# Patient Record
Sex: Male | Born: 1970 | Smoking: Never smoker
Health system: Southern US, Community
[De-identification: ages and names within clinical notes are randomized; demographics above are authoritative.]

## PROBLEM LIST (undated history)

## (undated) DIAGNOSIS — I10 Essential (primary) hypertension: Secondary | ICD-10-CM

## (undated) DIAGNOSIS — E119 Type 2 diabetes mellitus without complications: Secondary | ICD-10-CM

## (undated) DIAGNOSIS — K219 Gastro-esophageal reflux disease without esophagitis: Secondary | ICD-10-CM

## (undated) DIAGNOSIS — K5792 Diverticulitis of intestine, part unspecified, without perforation or abscess without bleeding: Secondary | ICD-10-CM

---

## 2016-05-05 ENCOUNTER — Other Ambulatory Visit: Payer: Self-pay | Admitting: *Deleted

## 2016-05-05 NOTE — Patient Outreach (Signed)
[facility](THN) Care Management  05/05/2016  Robert NeverBradford Kerr [birth year] [id number]   Subjective: Telephone call to patient's home number, message states invalid number, and unable to leave a message.   Objective: Per chart and [employer] review, patient hospitalized 03/27/16 - 03/31/16 for diverticulitis and perforation.    Assessment: Received [employer] Transition of care referral on 04/13/16.   Transition of care follow up pending patient contact.   Plan: RNCM will call patient for 2nd telephone outreach attempt, transition of care follow up, within 10 business days of receiving valid phone number. RNCM will contact [employer] RNCM to verify patient's contact phone number and obtain additional contact numbers if available within 10 business days.    Jesenya Bowditch H. Gardiner Barefootooper RN, BSN, CCM [facility] Telephonic CM Phone: [phone] Fax: [phone]

## 2016-05-06 ENCOUNTER — Other Ambulatory Visit: Payer: Self-pay | Admitting: *Deleted

## 2016-05-06 NOTE — Patient Outreach (Addendum)
[facility](THN) Care Management  05/06/2016  Robert Kerr Apr 28, 1971 [id number]  Subjective: RNCM contacted Robert Kerr [employer] RNCM via secure email and requested phone number verification.  Received secure email from Robert Kerr at [employer], states she has no contact numbers for this patient.   Objective: Per chart and [employer] i[employer]aborative review, patient hospitalized 03/27/16 - 03/31/16 for diverticulitis and perforation.  Patient has no primary MD listed in chart.  Per [employer] [facility] is Robert [facility].    Assessment: Received [employer] Transition of care referral on 04/13/16.   Transition of care follow up pending patient contact.   Plan: RNCM will call patient for 2nd telephone outreach attempt, transition of care follow up, within 10 business days of receiving valid phone number. RNCM will contact patient's [facility] to  request phone number verification, obtain additional contact numbers if available,  within 10 business days.    Kalee Broxton H. Gardiner Barefootooper RN, BSN, CCM [facility] Care Management [facility] Telephonic CM Phone: [phone] Fax: [phone]

## 2016-05-10 ENCOUNTER — Other Ambulatory Visit: Payer: Self-pay | Admitting: *Deleted

## 2016-05-10 ENCOUNTER — Encounter: Payer: Self-pay | Admitting: *Deleted

## 2016-05-10 NOTE — Patient Outreach (Addendum)
[facility](THN) Care Management  05/10/2016  Caryl NeverBradford Mellott [birth year] [id number]   Subjective: Telephone call to patient's home phone number, no answer, invalid number, and unable to leave a message.  Patient does not have a primary MD listed in chart and no contact number for patient's [facility].   Several MDs listed in google search with same name as [facility].    Objective:Per chart and [employer] i[employer]ative review, patient hospitalized 03/27/16 - 03/31/16 for diverticulitis and perforation.  Patient has no primary MD listed in chart.  Per iCollaborative [facility] is Dr. Robb MatarMillard Thomas.    Assessment: Received [employer] Transition of care referral on 04/13/16. Transition of care follow up pending patient contact.    Plan: RNCM will send patient unsuccessful outreach letter, [facility] pamphlet, and proceed with case closure, within 10 business days, if no return call from patient.     Alison Breeding H. Gardiner Barefootooper RN, BSN, CCM [facility] Care [facility] Telephonic CM Phone: [phone] Fax: [phone]

## 2016-05-24 ENCOUNTER — Other Ambulatory Visit: Payer: Self-pay | Admitting: *Deleted

## 2016-05-24 NOTE — Patient Outreach (Signed)
[facility](THN) Care Management  05/24/2016  Caryl NeverBradford Hostetler [birth year] [id number]  No response from patient outreach attempts will proceed with case closure.  Objective:Per chart and [employer] i[employer]aborative review, patient hospitalized 03/27/16 - 03/31/16 for diverticulitis and perforation. Patient has no primary MD listed in chart. Per iCollaborative patient[facility] is Dr. Robb MatarMillard Thomas.    Assessment: Received [employer] Transition of care referral on 04/13/16. Transition of care follow up not completed due to patient being unable to contact.  Will proceed with case closure.   Plan: RNCM will send case closure due to unable to contact request to Iverson AlaminLaura Greeson at [facility].     Kerstin Crusoe H. Gardiner Barefootooper RN, BSN, CCM [facility] Care Management [facility] Telephonic CM Phone: [phone] Fax: [phone]

## 2018-06-14 ENCOUNTER — Emergency Department (HOSPITAL_COMMUNITY): Payer: Managed Care, Other (non HMO)

## 2018-06-14 ENCOUNTER — Inpatient Hospital Stay (HOSPITAL_COMMUNITY)
Admission: EM | Admit: 2018-06-14 | Discharge: 2018-06-15 | DRG: 247 | Disposition: A | Discharge status: Home / Self Care | Payer: Managed Care, Other (non HMO) | Attending: Interventional Cardiology | Admitting: Interventional Cardiology

## 2018-06-14 ENCOUNTER — Encounter (HOSPITAL_COMMUNITY): Payer: Self-pay | Admitting: Emergency Medicine

## 2018-06-14 ENCOUNTER — Other Ambulatory Visit: Payer: Self-pay

## 2018-06-14 ENCOUNTER — Encounter (HOSPITAL_COMMUNITY)
Admission: EM | Disposition: A | Discharge status: Home / Self Care | Payer: Self-pay | Source: Home / Self Care | Attending: Interventional Cardiology

## 2018-06-14 DIAGNOSIS — Z7984 Long term (current) use of oral hypoglycemic drugs: Secondary | ICD-10-CM | POA: Diagnosis not present

## 2018-06-14 DIAGNOSIS — I2510 Atherosclerotic heart disease of native coronary artery without angina pectoris: Secondary | ICD-10-CM

## 2018-06-14 DIAGNOSIS — R079 Chest pain, unspecified: Secondary | ICD-10-CM | POA: Diagnosis present

## 2018-06-14 DIAGNOSIS — E119 Type 2 diabetes mellitus without complications: Secondary | ICD-10-CM | POA: Diagnosis present

## 2018-06-14 DIAGNOSIS — Z79899 Other long term (current) drug therapy: Secondary | ICD-10-CM

## 2018-06-14 DIAGNOSIS — Z8249 Family history of ischemic heart disease and other diseases of the circulatory system: Secondary | ICD-10-CM | POA: Diagnosis not present

## 2018-06-14 DIAGNOSIS — Z6841 Body Mass Index (BMI) 40.0 and over, adult: Secondary | ICD-10-CM

## 2018-06-14 DIAGNOSIS — Z885 Allergy status to narcotic agent status: Secondary | ICD-10-CM | POA: Diagnosis not present

## 2018-06-14 DIAGNOSIS — Z8673 Personal history of transient ischemic attack (TIA), and cerebral infarction without residual deficits: Secondary | ICD-10-CM

## 2018-06-14 DIAGNOSIS — E785 Hyperlipidemia, unspecified: Secondary | ICD-10-CM | POA: Diagnosis present

## 2018-06-14 DIAGNOSIS — Z955 Presence of coronary angioplasty implant and graft: Secondary | ICD-10-CM | POA: Diagnosis not present

## 2018-06-14 DIAGNOSIS — I214 Non-ST elevation (NSTEMI) myocardial infarction: Secondary | ICD-10-CM | POA: Diagnosis present

## 2018-06-14 DIAGNOSIS — I10 Essential (primary) hypertension: Secondary | ICD-10-CM | POA: Diagnosis present

## 2018-06-14 DIAGNOSIS — E782 Mixed hyperlipidemia: Secondary | ICD-10-CM | POA: Diagnosis not present

## 2018-06-14 DIAGNOSIS — E1159 Type 2 diabetes mellitus with other circulatory complications: Secondary | ICD-10-CM

## 2018-06-14 DIAGNOSIS — Z880 Allergy status to penicillin: Secondary | ICD-10-CM

## 2018-06-14 DIAGNOSIS — K219 Gastro-esophageal reflux disease without esophagitis: Secondary | ICD-10-CM | POA: Diagnosis present

## 2018-06-14 DIAGNOSIS — E669 Obesity, unspecified: Secondary | ICD-10-CM | POA: Diagnosis present

## 2018-06-14 HISTORY — DX: Gastro-esophageal reflux disease without esophagitis: K21.9

## 2018-06-14 HISTORY — DX: Diverticulitis of intestine, part unspecified, without perforation or abscess without bleeding: K57.92

## 2018-06-14 HISTORY — DX: Essential (primary) hypertension: I10

## 2018-06-14 HISTORY — PX: LEFT HEART CATH AND CORONARY ANGIOGRAPHY: CATH118249

## 2018-06-14 HISTORY — PX: CORONARY STENT INTERVENTION: CATH118234

## 2018-06-14 HISTORY — DX: Type 2 diabetes mellitus without complications: E11.9

## 2018-06-14 LAB — CBC
HCT: 46.7 % (ref 39.0–52.0)
HCT: 43.3 % (ref 39.0–52.0)
Hemoglobin: 15.4 g/dL (ref 13.0–17.0)
Hemoglobin: 14 g/dL (ref 13.0–17.0)
MCH: 28.1 pg (ref 26.0–34.0)
MCH: 27.2 pg (ref 26.0–34.0)
MCHC: 33 g/dL (ref 30.0–36.0)
MCHC: 32.3 g/dL (ref 30.0–36.0)
MCV: 85.2 fL (ref 80.0–100.0)
MCV: 84.1 fL (ref 80.0–100.0)
Platelets: 242 10*3/uL (ref 150–400)
Platelets: 266 10*3/uL (ref 150–400)
RBC: 5.48 MIL/uL (ref 4.22–5.81)
RBC: 5.15 MIL/uL (ref 4.22–5.81)
RDW: 12.4 % (ref 11.5–15.5)
RDW: 12.5 % (ref 11.5–15.5)
WBC: 9.1 10*3/uL (ref 4.0–10.5)
WBC: 12.8 10*3/uL — ABNORMAL HIGH (ref 4.0–10.5)
nRBC: 0 % (ref 0.0–0.2)
nRBC: 0 % (ref 0.0–0.2)

## 2018-06-14 LAB — I-STAT TROPONIN, ED
Troponin i, poc: 0.02 ng/mL (ref 0.00–0.08)
Troponin i, poc: 0.1 ng/mL (ref 0.00–0.08)

## 2018-06-14 LAB — BASIC METABOLIC PANEL
Anion gap: 9 (ref 5–15)
BUN: 11 mg/dL (ref 6–20)
CO2: 25 mmol/L (ref 22–32)
Calcium: 9.4 mg/dL (ref 8.9–10.3)
Chloride: 105 mmol/L (ref 98–111)
Creatinine, Ser: 1 mg/dL (ref 0.61–1.24)
GFR calc Af Amer: >60 mL/min (ref >60)
GFR calc non Af Amer: >60 mL/min (ref >60)
Glucose, Bld: 183 mg/dL — ABNORMAL HIGH (ref 70–99)
Potassium: 4.5 mmol/L (ref 3.5–5.1)
Sodium: 139 mmol/L (ref 135–145)

## 2018-06-14 LAB — POCT ACTIVATED CLOTTING TIME
ACTIVATED CLOTTING TIME: 257 s
ACTIVATED CLOTTING TIME: 279 s
Activated Clotting Time: 252 seconds
Activated Clotting Time: 252 seconds

## 2018-06-14 LAB — CREATININE, SERUM
Creatinine, Ser: 0.98 mg/dL (ref 0.61–1.24)
GFR calc Af Amer: >60 mL/min (ref >60)
GFR calc non Af Amer: >60 mL/min (ref >60)

## 2018-06-14 LAB — TROPONIN I: Troponin I: 4 ng/mL (ref <0.03)

## 2018-06-14 SURGERY — LEFT HEART CATH AND CORONARY ANGIOGRAPHY
Anesthesia: LOCAL

## 2018-06-14 MED ORDER — OXYCODONE HCL 5 MG PO TABS
5.0000 mg | ORAL_TABLET | ORAL | Status: DC | PRN
  Administered 2018-06-14 (×2): 10 mg via ORAL
  Administered 2018-06-15: 5 mg via ORAL
  Filled 2018-06-14: qty 2
  Filled 2018-06-14: qty 1
  Filled 2018-06-14: qty 2

## 2018-06-14 MED ORDER — ACETAMINOPHEN 325 MG PO TABS
650.0000 mg | ORAL_TABLET | ORAL | Status: DC | PRN
  Administered 2018-06-14 – 2018-06-15 (×2): 650 mg via ORAL

## 2018-06-14 MED ORDER — HYDROCHLOROTHIAZIDE 12.5 MG PO CAPS
12.5000 mg | ORAL_CAPSULE | Freq: Every day | ORAL | Status: DC
  Administered 2018-06-15: 12.5 mg via ORAL
  Filled 2018-06-14: qty 1

## 2018-06-14 MED ORDER — TICAGRELOR 90 MG PO TABS
ORAL_TABLET | ORAL | Status: DC | PRN
  Administered 2018-06-14: 180 mg via ORAL

## 2018-06-14 MED ORDER — HEPARIN SODIUM (PORCINE) 5000 UNIT/ML IJ SOLN
5000.0000 [IU] | Freq: Three times a day (TID) | INTRAMUSCULAR | Status: DC
  Filled 2018-06-14: qty 1

## 2018-06-14 MED ORDER — ALUM & MAG HYDROXIDE-SIMETH 200-200-20 MG/5ML PO SUSP
30.0000 mL | ORAL | Status: DC | PRN
  Administered 2018-06-14 (×2): 30 mL via ORAL
  Filled 2018-06-14 (×2): qty 30

## 2018-06-14 MED ORDER — TIROFIBAN HCL IN NACL 5-0.9 MG/100ML-% IV SOLN
INTRAVENOUS | Status: AC
  Filled 2018-06-14: qty 100

## 2018-06-14 MED ORDER — ONDANSETRON HCL 4 MG/2ML IJ SOLN
4.0000 mg | Freq: Four times a day (QID) | INTRAMUSCULAR | Status: DC | PRN
  Filled 2018-06-14: qty 2

## 2018-06-14 MED ORDER — NITROGLYCERIN IN D5W 200-5 MCG/ML-% IV SOLN
0.0000 ug/min | INTRAVENOUS | Status: DC
  Administered 2018-06-14: 10 ug/min via INTRAVENOUS
  Filled 2018-06-14: qty 250

## 2018-06-14 MED ORDER — ATORVASTATIN CALCIUM 80 MG PO TABS: 80.0000 mg | ORAL_TABLET | Freq: Every day | ORAL | Status: DC

## 2018-06-14 MED ORDER — SODIUM CHLORIDE 0.9% FLUSH
3.0000 mL | Freq: Two times a day (BID) | INTRAVENOUS | Status: DC
  Administered 2018-06-14: 3 mL via INTRAVENOUS

## 2018-06-14 MED ORDER — HEPARIN (PORCINE) IN NACL 1000-0.9 UT/500ML-% IV SOLN
INTRAVENOUS | Status: AC
  Filled 2018-06-14: qty 1000

## 2018-06-14 MED ORDER — VERAPAMIL HCL 2.5 MG/ML IV SOLN
INTRAVENOUS | Status: AC
  Filled 2018-06-14: qty 2

## 2018-06-14 MED ORDER — SODIUM CHLORIDE 0.9 % WEIGHT BASED INFUSION
0.8000 mL/kg/h | INTRAVENOUS | Status: AC
  Administered 2018-06-14: 0.8 mL/kg/h via INTRAVENOUS

## 2018-06-14 MED ORDER — TIROFIBAN HCL IN NACL 5-0.9 MG/100ML-% IV SOLN
INTRAVENOUS | Status: AC | PRN
  Administered 2018-06-14 (×2): 0.15 ug/kg/min via INTRAVENOUS

## 2018-06-14 MED ORDER — SODIUM CHLORIDE 0.9 % IV SOLN: 250.0000 mL | INTRAVENOUS | Status: DC | PRN

## 2018-06-14 MED ORDER — OLMESARTAN-AMLODIPINE-HCTZ 20-5-12.5 MG PO TABS: 1.0000 | ORAL_TABLET | Freq: Every day | ORAL | Status: DC

## 2018-06-14 MED ORDER — MORPHINE SULFATE (PF) 2 MG/ML IV SOLN: 2.0000 mg | INTRAVENOUS | Status: DC | PRN

## 2018-06-14 MED ORDER — SODIUM CHLORIDE 0.9 % WEIGHT BASED INFUSION
3.0000 mL/kg/h | INTRAVENOUS | Status: DC
  Administered 2018-06-14: 3 mL/kg/h via INTRAVENOUS

## 2018-06-14 MED ORDER — HEPARIN (PORCINE) IN NACL 1000-0.9 UT/500ML-% IV SOLN
INTRAVENOUS | Status: DC | PRN
  Administered 2018-06-14: 500 mL

## 2018-06-14 MED ORDER — TIROFIBAN (AGGRASTAT) BOLUS VIA INFUSION
INTRAVENOUS | Status: DC | PRN
  Administered 2018-06-14: 3242.5 ug via INTRAVENOUS

## 2018-06-14 MED ORDER — NITROGLYCERIN 1 MG/10 ML FOR IR/CATH LAB
INTRA_ARTERIAL | Status: DC | PRN
  Administered 2018-06-14 (×2): 200 ug via INTRACORONARY

## 2018-06-14 MED ORDER — MIDAZOLAM HCL 2 MG/2ML IJ SOLN
INTRAMUSCULAR | Status: DC | PRN
  Administered 2018-06-14: 1 mg via INTRAVENOUS

## 2018-06-14 MED ORDER — SODIUM CHLORIDE 0.9% FLUSH: 3.0000 mL | INTRAVENOUS | Status: DC | PRN

## 2018-06-14 MED ORDER — ONDANSETRON HCL 4 MG/2ML IJ SOLN
INTRAMUSCULAR | Status: AC
  Filled 2018-06-14: qty 2

## 2018-06-14 MED ORDER — VERAPAMIL HCL 2.5 MG/ML IV SOLN
INTRAVENOUS | Status: DC | PRN
  Administered 2018-06-14: 10 mL via INTRA_ARTERIAL

## 2018-06-14 MED ORDER — HEPARIN SODIUM (PORCINE) 5000 UNIT/ML IJ SOLN
5000.0000 [IU] | Freq: Three times a day (TID) | INTRAMUSCULAR | Status: DC
  Administered 2018-06-15: 5000 [IU] via SUBCUTANEOUS
  Filled 2018-06-14: qty 1

## 2018-06-14 MED ORDER — IRBESARTAN 150 MG PO TABS
150.0000 mg | ORAL_TABLET | Freq: Every day | ORAL | Status: DC
  Administered 2018-06-15: 150 mg via ORAL
  Filled 2018-06-14 (×2): qty 1

## 2018-06-14 MED ORDER — LIDOCAINE HCL (PF) 1 % IJ SOLN
INTRAMUSCULAR | Status: AC
  Filled 2018-06-14: qty 30

## 2018-06-14 MED ORDER — SODIUM CHLORIDE 0.9% FLUSH: 3.0000 mL | Freq: Two times a day (BID) | INTRAVENOUS | Status: DC

## 2018-06-14 MED ORDER — TICAGRELOR 90 MG PO TABS
90.0000 mg | ORAL_TABLET | Freq: Two times a day (BID) | ORAL | Status: DC
  Administered 2018-06-15: 90 mg via ORAL
  Filled 2018-06-14: qty 1

## 2018-06-14 MED ORDER — HEPARIN (PORCINE) 25000 UT/250ML-% IV SOLN
1300.0000 [IU]/h | INTRAVENOUS | Status: DC
  Administered 2018-06-14: 1300 [IU]/h via INTRAVENOUS
  Filled 2018-06-14: qty 250

## 2018-06-14 MED ORDER — MORPHINE SULFATE (PF) 4 MG/ML IV SOLN
4.0000 mg | Freq: Once | INTRAVENOUS | Status: AC
  Administered 2018-06-14: 4 mg via INTRAVENOUS
  Filled 2018-06-14: qty 1

## 2018-06-14 MED ORDER — HEPARIN SODIUM (PORCINE) 1000 UNIT/ML IJ SOLN
INTRAMUSCULAR | Status: AC
  Filled 2018-06-14: qty 2

## 2018-06-14 MED ORDER — ACETAMINOPHEN 325 MG PO TABS
650.0000 mg | ORAL_TABLET | ORAL | Status: DC | PRN
  Filled 2018-06-14 (×2): qty 2

## 2018-06-14 MED ORDER — HEPARIN SODIUM (PORCINE) 1000 UNIT/ML IJ SOLN
INTRAMUSCULAR | Status: AC
  Filled 2018-06-14: qty 1

## 2018-06-14 MED ORDER — ASPIRIN EC 81 MG PO TBEC
81.0000 mg | DELAYED_RELEASE_TABLET | Freq: Every day | ORAL | Status: DC
  Administered 2018-06-15: 81 mg via ORAL
  Filled 2018-06-14: qty 1

## 2018-06-14 MED ORDER — MORPHINE SULFATE (PF) 4 MG/ML IV SOLN: 4.0000 mg | Freq: Once | INTRAVENOUS | Status: DC

## 2018-06-14 MED ORDER — NITROGLYCERIN 1 MG/10 ML FOR IR/CATH LAB
INTRA_ARTERIAL | Status: AC
  Filled 2018-06-14: qty 10

## 2018-06-14 MED ORDER — SODIUM CHLORIDE 0.9 % IV SOLN: INTRAVENOUS | Status: DC

## 2018-06-14 MED ORDER — TIROFIBAN HCL IN NACL 5-0.9 MG/100ML-% IV SOLN
0.1500 ug/kg/min | INTRAVENOUS | Status: AC
  Administered 2018-06-14 – 2018-06-15 (×3): 0.15 ug/kg/min via INTRAVENOUS
  Filled 2018-06-14 (×3): qty 100
  Filled 2018-06-14: qty 200

## 2018-06-14 MED ORDER — ONDANSETRON HCL 4 MG/2ML IJ SOLN
INTRAMUSCULAR | Status: DC | PRN
  Administered 2018-06-14: 4 mg via INTRAVENOUS

## 2018-06-14 MED ORDER — AMLODIPINE BESYLATE 5 MG PO TABS
5.0000 mg | ORAL_TABLET | Freq: Every day | ORAL | Status: DC
  Administered 2018-06-15: 5 mg via ORAL
  Filled 2018-06-14: qty 1

## 2018-06-14 MED ORDER — HYDRALAZINE HCL 20 MG/ML IJ SOLN: 5.0000 mg | INTRAMUSCULAR | Status: AC | PRN

## 2018-06-14 MED ORDER — ASPIRIN 81 MG PO CHEW: 81.0000 mg | CHEWABLE_TABLET | Freq: Every day | ORAL | Status: DC

## 2018-06-14 MED ORDER — LIDOCAINE HCL (PF) 1 % IJ SOLN
INTRAMUSCULAR | Status: DC | PRN
  Administered 2018-06-14: 2 mL

## 2018-06-14 MED ORDER — METOPROLOL TARTRATE 12.5 MG HALF TABLET
12.5000 mg | ORAL_TABLET | Freq: Two times a day (BID) | ORAL | Status: DC
  Administered 2018-06-14 – 2018-06-15 (×2): 12.5 mg via ORAL
  Filled 2018-06-14 (×2): qty 1

## 2018-06-14 MED ORDER — HEPARIN SODIUM (PORCINE) 1000 UNIT/ML IJ SOLN
INTRAMUSCULAR | Status: DC | PRN
  Administered 2018-06-14: 6000 [IU] via INTRAVENOUS
  Administered 2018-06-14: 1500 [IU] via INTRAVENOUS
  Administered 2018-06-14: 2500 [IU] via INTRAVENOUS
  Administered 2018-06-14: 6000 [IU] via INTRAVENOUS
  Administered 2018-06-14: 1500 [IU] via INTRAVENOUS

## 2018-06-14 MED ORDER — FENTANYL CITRATE (PF) 100 MCG/2ML IJ SOLN
INTRAMUSCULAR | Status: DC | PRN
  Administered 2018-06-14 (×3): 25 ug via INTRAVENOUS

## 2018-06-14 MED ORDER — NITROGLYCERIN 0.4 MG SL SUBL
0.4000 mg | SUBLINGUAL_TABLET | SUBLINGUAL | Status: DC | PRN
  Administered 2018-06-14: 0.4 mg via SUBLINGUAL
  Filled 2018-06-14: qty 1

## 2018-06-14 MED ORDER — FENTANYL CITRATE (PF) 100 MCG/2ML IJ SOLN
INTRAMUSCULAR | Status: AC
  Filled 2018-06-14: qty 2

## 2018-06-14 MED ORDER — LABETALOL HCL 5 MG/ML IV SOLN: 10.0000 mg | INTRAVENOUS | Status: AC | PRN

## 2018-06-14 MED ORDER — MIDAZOLAM HCL 2 MG/2ML IJ SOLN
INTRAMUSCULAR | Status: AC
  Filled 2018-06-14: qty 2

## 2018-06-14 MED ORDER — SODIUM CHLORIDE 0.9 % WEIGHT BASED INFUSION: 1.0000 mL/kg/h | INTRAVENOUS | Status: DC

## 2018-06-14 MED ORDER — HEPARIN BOLUS VIA INFUSION
4000.0000 [IU] | Freq: Once | INTRAVENOUS | Status: AC
  Administered 2018-06-14: 4000 [IU] via INTRAVENOUS
  Filled 2018-06-14: qty 4000

## 2018-06-14 MED ORDER — PANTOPRAZOLE SODIUM 40 MG PO TBEC
40.0000 mg | DELAYED_RELEASE_TABLET | Freq: Every day | ORAL | Status: DC
  Administered 2018-06-15: 40 mg via ORAL
  Filled 2018-06-14: qty 1

## 2018-06-14 MED ORDER — ONDANSETRON HCL 4 MG/2ML IJ SOLN: 4.0000 mg | Freq: Four times a day (QID) | INTRAMUSCULAR | Status: DC | PRN

## 2018-06-14 SURGICAL SUPPLY — 22 items
BALLN SAPPHIRE 2.0X12 (BALLOONS) ×2
BALLN SAPPHIRE 2.5X12 (BALLOONS) ×2
BALLN SAPPHIRE ~~LOC~~ 3.25X12 (BALLOONS) ×2 IMPLANT
BALLOON SAPPHIRE 2.0X12 (BALLOONS) ×1 IMPLANT
BALLOON SAPPHIRE 2.5X12 (BALLOONS) ×1 IMPLANT
CATH 5FR JL3.5 JR4 ANG PIG MP (CATHETERS) ×2 IMPLANT
CATH LAUNCHER 5F EBU3.0 (CATHETERS) ×1 IMPLANT
CATH LAUNCHER 6FR JR4 (CATHETERS) ×2 IMPLANT
CATHETER LAUNCHER 5F EBU3.0 (CATHETERS) ×2
DEVICE RAD COMP TR BAND LRG (VASCULAR PRODUCTS) ×2 IMPLANT
GLIDESHEATH SLEND A-KIT 6F 22G (SHEATH) ×2 IMPLANT
GUIDEWIRE INQWIRE 1.5J.035X260 (WIRE) ×1 IMPLANT
HOVERMATT SINGLE USE (MISCELLANEOUS) ×2 IMPLANT
INQWIRE 1.5J .035X260CM (WIRE) ×2
KIT ENCORE 26 ADVANTAGE (KITS) ×2 IMPLANT
KIT HEART LEFT (KITS) ×2 IMPLANT
PACK CARDIAC CATHETERIZATION (CUSTOM PROCEDURE TRAY) ×2 IMPLANT
SHEATH PROBE COVER 6X72 (BAG) ×2 IMPLANT
STENT SYNERGY DES 2.75X20 (Permanent Stent) ×2 IMPLANT
TRANSDUCER W/STOPCOCK (MISCELLANEOUS) ×2 IMPLANT
TUBING CIL FLEX 10 FLL-RA (TUBING) ×2 IMPLANT
WIRE SION BLUE 180 (WIRE) ×2 IMPLANT

## 2018-06-14 NOTE — Plan of Care (Signed)
  Problem: Education: Goal: Knowledge of General Education information will improve Description Including pain rating scale, medication(s)/side effects and non-pharmacologic comfort measures Outcome: Progressing   Problem: Education: Goal: Understanding of CV disease, CV risk reduction, and recovery process will improve Outcome: Progressing   Problem: Cardiovascular: Goal: Ability to achieve and maintain adequate cardiovascular perfusion will improve Outcome: Progressing   Problem: Cardiovascular: Goal: Vascular access site(s) Level 0-1 will be maintained Outcome: Progressing

## 2018-06-14 NOTE — ED Triage Notes (Addendum)
Pt arrives to ED from home with complaints of chest pain starting yesterday after he fasted for his doctors appoinment. CP resolved yesterday after taking rolaids. EMS reports pt awoke this morning with substernal CP that radiates to both arms. Pt took 2x nitro, 325 ASA, and 4mg  zofran upon arrival. Pt CP currently at 6/10.

## 2018-06-14 NOTE — Interval H&P Note (Signed)
Cath Lab Visit (complete for each Cath Lab visit)  Clinical Evaluation Leading to the Procedure:   ACS: Yes.    Non-ACS:    Anginal Classification: CCS III  Anti-ischemic medical therapy: Minimal Therapy (1 class of medications)  Non-Invasive Test Results: No non-invasive testing performed  Prior CABG: No previous CABG      History and Physical Interval Note:  06/14/2018 3:56 PM  Robert Kerr  has presented today for surgery, with the diagnosis of chest pain  The various methods of treatment have been discussed with the patient and family. After consideration of risks, benefits and other options for treatment, the patient has consented to  Procedure(s): LEFT HEART CATH AND CORONARY ANGIOGRAPHY (N/A) as a surgical intervention .  The patient's history has been reviewed, patient examined, no change in status, stable for surgery.  I have reviewed the patient's chart and labs.  Questions were answered to the patient's satisfaction.     Lyn Records III

## 2018-06-14 NOTE — Progress Notes (Signed)
ANTICOAGULATION CONSULT NOTE - Initial Consult  Pharmacy Consult for heparin Indication: chest pain/ACS   Patient Measurements: Height: 5\' 10"  (177.8 cm) Weight: 286 lb (129.7 kg) IBW/kg (Calculated) : 73 Heparin Dosing Weight: 103 kg  Vital Signs: Temp: 97.5 F (36.4 C) (02/12 0825) Temp Source: Oral (02/12 0825) BP: 157/102 (02/12 1315) Pulse Rate: 83 (02/12 1315)  Labs: Recent Labs    06/14/18 0826  HGB 15.4  HCT 46.7  PLT 242  CREATININE 1.00    Assessment: 48 yo male admitted with chest pain. Starting heparin infusion for rule out ACS. No anticoagulation prior to admission. Troponin is elevated. H/h plts wnl.  Goal of Therapy:  Heparin level 0.3-0.7 units/ml Monitor platelets by anticoagulation protocol: Yes    Plan:  -Heparin bolus 4000 units x1 then 1300 units/hr -Daily HL, CBC -Check HL in 6 hours   Baldemar Friday 06/14/2018,1:29 PM

## 2018-06-14 NOTE — Progress Notes (Signed)
At roughly 2300, pt's HR went into 140's while he vomited approximately . Pt has been allowed 2 cups of ice chips since 1900, & recently had 2 bites of jell-o. The jell-o is what precipitated the vomiting episode. After emesis, pt stated he felt better w/ no chest pain.  Vital signs currently stable & pt resting. Will continue to monitor.

## 2018-06-14 NOTE — ED Notes (Signed)
Gave pt urinal 

## 2018-06-14 NOTE — CV Procedure (Signed)
   Left heart cath, coronary angiography, and PCI of right coronary via right radial.  Radial access was achieved with real-time vascular ultrasound.  Widely patent left main.  LAD is large, wraps around the left ventricular apex, has luminal irregularities throughout the mid and distal vessel but no high-grade obstruction.  Circumflex gives origin to 3 obtuse marginals as well as a circumflex PDA.  The first marginal contains mid vessel 60% narrowing.  The second marginal contains mid vessel was difficult to visualize.  The may be a high-grade stenosis in the mid segment.  The third marginal and PDA appear okay.  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  Based on the size of the circumflex, it was felt to be a nondominant vessel.  After discussing findings with the patient he revealed that he still had grade 2/10 chest discomfort and grade 4/10 left arm and armpit discomfort.  Though much less intense and early in the morning it is still present.  Because of ongoing discomfort we pursued PCI of the right coronary.  The lesion was hard to cross.  The lesion was difficult to fully expand.  Ultimately a 20 x 2.75 Synergy was positioned and deployed, ultimately postdilated to 3.25 mm in diameter.  Thrombus embolized into the second acute marginal branch.  Sluggish flow was noted despite wide patency.  Aggrastat was started.  Ending ACT 275 seconds.

## 2018-06-14 NOTE — ED Provider Notes (Signed)
[facility] EMERGENCY DEPARTMENT Provider Note   CSN: [id number] Arrival date & time: 06/14/18  0820     History   Chief Complaint Chief Complaint  Patient presents with  . Chest Pain    HPI Robert Kerr is a 48 y.o. male with history of diabetes, hypertension, diverticulitis presenting for evaluation of acute onset, constant chest pains since around 6 AM this morning.  He reports that he went to see his PCP yesterday for a yearly physical and afterwards at around 10:30 AM he noticed some mild substernal chest pressure which he thought was related to acid reflux as he was fasting for blood work.  He took some Rolaids with resolution of his pain.  He reports that the pain did not radiate and lasted approximately 20 minutes before resolving.  He did not have any chest pain for the remainder of the day yesterday.  He reports that when he awoke this morning he did not have any pain, showered, and then went downstairs to get ready for work.  States that within 20 minutes of going downstairs he developed severe substernal chest pressure radiating to the bilateral upper extremities.  He notes associated shortness of breath, lightheadedness, diaphoresis, and nausea.  Denies abdominal pain, vomiting, or syncope.  He received 325 mg aspirin and 2 sublingual nitroglycerin and 4 mg of Zofran with EMS with improvement in his pain from 10/10 in severity to 6/10 in severity.  Reports that he has never had pain like this before.  He is a non-smoker, denies recreational drug use or alcohol use.  Reports that his father has had multiple heart attacks, his first in his 64s.  The patient does not have a cardiologist and has not had any stress testing.  He does not have any history of hyperlipidemia to his knowledge.  He was out of his diabetes medicines for the last 2 weeks or so and his PCP was going to switch him to a new medication but he has not started this yet.  The history is provided by  the patient.    Past Medical History:  Diagnosis Date  . Diabetes mellitus without complication (HCC)   . Diverticulitis   . GERD (gastroesophageal reflux disease)   . Hypertension     Patient Active Problem List   Diagnosis Date Noted  . NSTEMI (non-ST elevated myocardial infarction) (HCC) 06/14/2018    History reviewed. No pertinent surgical history.      Home Medications    Prior to Admission medications   Medication Sig Start Date End Date Taking? Authorizing Provider  DM-Doxylamine-Acetaminophen (NYQUIL HBP COLD & FLU) 15-6.25-325 MG/15ML LIQD Take 15 mLs by mouth every 4 (four) hours as needed (cold symptoms).   Yes [provider]  FARXIGA 10 MG TABS tablet Take 10 mg by mouth daily. 06/13/18  Yes [provider]  Guaifenesin 1200 MG TB12 Take 1 tablet by mouth every 12 (twelve) hours as needed (congestion).   Yes [provider]  loratadine (CLARITIN) 10 MG tablet Take 10 mg by mouth daily.   Yes [provider]  Olmesartan-amLODIPine-HCTZ 20-5-12.5 MG TABS Take 1 tablet by mouth daily. 06/13/18  Yes [provider]  omeprazole (PRILOSEC) 20 MG capsule Take 20 mg by mouth daily. 06/13/18  Yes [provider]  Probiotic Product (PROBIOTIC-10 ULTIMATE) CAPS Take 1 tablet by mouth daily.   Yes [provider]  Pseudoeph-Doxylamine-DM-APAP (NYQUIL MULTI-SYMPTOM PO) Take 2 capsules by mouth every 6 (six) hours as needed (  cold symptoms).   Yes [provider]  valACYclovir (VALTREX) 1000 MG tablet Take 1 g by mouth as needed. Cold sores 06/13/18  Yes [provider]  Vitamin D, Ergocalciferol, (DRISDOL) 1.25 MG (50000 UT) CAPS capsule Take 50,000 Units by mouth 2 (two) times a week. Monday, Wednesday 06/13/18   [provider]    Family History Family History  Problem Relation Age of Onset  . Heart disease Father   . Heart attack Father   . Heart disease Brother     Social  History Social History   Tobacco Use  . Smoking status: Never Smoker  . Smokeless tobacco: Never Used  Substance Use Topics  . Alcohol use: Not Currently  . Drug use: Never     Allergies   Demerol [meperidine hcl]; Erythromycin; and Penicillins   Review of Systems Review of Systems  Constitutional: Positive for diaphoresis. Negative for chills and fever.  Respiratory: Positive for shortness of breath.   Cardiovascular: Positive for chest pain.  Gastrointestinal: Positive for nausea. Negative for abdominal pain and vomiting.  Neurological: Positive for light-headedness. Negative for syncope.  All other systems reviewed and are negative.    Physical Exam Updated Vital Signs BP (!) 157/102   Pulse 83   Temp (!) 97.5 F (36.4 C) (Oral)   Resp 11   Ht 5\' 10"  (1.778 m)   Wt 129.7 kg   SpO2 96%   BMI 41.04 kg/m   Physical Exam Vitals signs and nursing note reviewed.  Constitutional:      General: He is not in acute distress.    Appearance: He is well-developed. He is obese.  HENT:     Head: Normocephalic and atraumatic.  Eyes:     General:        Right eye: No discharge.        Left eye: No discharge.     Conjunctiva/sclera: Conjunctivae normal.  Neck:     Musculoskeletal: Normal range of motion and neck supple.     Vascular: No JVD.     Trachea: No tracheal deviation.  Cardiovascular:     Rate and Rhythm: Normal rate and regular rhythm.     Pulses:          Radial pulses are 2+ on the right side and 2+ on the left side.       Dorsalis pedis pulses are 2+ on the right side and 2+ on the left side.       Posterior tibial pulses are 2+ on the right side and 2+ on the left side.     Comments: Homan's sign absent bilaterally, no lower extremity edema, no palpable cords, compartments are soft  Pulmonary:     Effort: Pulmonary effort is normal.     Breath sounds: Normal breath sounds.  Chest:     Chest wall: No mass.  Abdominal:     General: Bowel sounds are  normal. There is no distension.     Palpations: Abdomen is soft.  Musculoskeletal: Normal range of motion.     Right lower leg: He exhibits no tenderness.     Left lower leg: He exhibits no tenderness. No edema.  Skin:    General: Skin is warm and dry.     Findings: No erythema.  Neurological:     General: No focal deficit present.     Mental Status: He is alert.  Psychiatric:        Behavior: Behavior normal.  ED Treatments / Results  Labs (all labs ordered are listed, but only abnormal results are displayed) Labs Reviewed  BASIC METABOLIC PANEL - Abnormal; Notable for the following components:      Result Value   Glucose, Bld 183 (*)    All other components within normal limits  I-STAT TROPONIN, ED - Abnormal; Notable for the following components:   Troponin i, poc 0.10 (*)    All other components within normal limits  CBC  HIV ANTIBODY (ROUTINE TESTING W REFLEX)  TROPONIN I  TROPONIN I  I-STAT TROPONIN, ED    EKG EKG Interpretation  Date/Time:  Wednesday June 14 2018 08:24:07 EST Ventricular Rate:  77 PR Interval:    QRS Duration: 131 QT Interval:  382 QTC Calculation: 433 R Axis:   16 Text Interpretation:  Sinus rhythm Right bundle branch block Baseline wander in lead(s) V5 V6 No previous tracing Confirmed by Gwyneth Sprout ([id number]) on 06/14/2018 8:31:24 AM   Radiology Dg Chest 2 View  Result Date: 06/14/2018 CLINICAL DATA:  Chest pain EXAM: CHEST - 2 VIEW COMPARISON:  None. FINDINGS: Lungs are clear. Heart size and pulmonary vascularity are normal. No adenopathy. No bone lesions. No pneumothorax. IMPRESSION: No edema or consolidation. Electronically Signed   By: Bretta Bang III M.D.   On: 06/14/2018 09:03    Procedures .Critical Care Performed by: Jeanie Sewer, PA-C Authorized by: Jeanie Sewer, PA-C   Critical care provider statement:    Critical care time (minutes):  40   Critical care was necessary to treat or prevent imminent or  life-threatening deterioration of the following conditions:  Cardiac failure   Critical care was time spent personally by me on the following activities:  Discussions with consultants, evaluation of patient's response to treatment, examination of patient, ordering and performing treatments and interventions, ordering and review of laboratory studies, ordering and review of radiographic studies, pulse oximetry, re-evaluation of patient's condition, obtaining history from patient or surrogate and review of old charts   I assumed direction of critical care for this patient from another provider in my specialty: no     (including critical care time)  Medications Ordered in ED Medications  nitroGLYCERIN (NITROSTAT) SL tablet 0.4 mg (0.4 mg Sublingual Given 06/14/18 0925)  0.9 %  sodium chloride infusion (has no administration in time range)  nitroGLYCERIN 50 mg in dextrose 5 % 250 mL (0.2 mg/mL) infusion (10 mcg/min Intravenous New Bag/Given 06/14/18 1253)  aspirin EC tablet 81 mg (has no administration in time range)  acetaminophen (TYLENOL) tablet 650 mg (has no administration in time range)  ondansetron (ZOFRAN) injection 4 mg (has no administration in time range)  Olmesartan-amLODIPine-HCTZ 20-5-12.5 MG TABS 1 tablet (has no administration in time range)  pantoprazole (PROTONIX) EC tablet 40 mg (has no administration in time range)  morphine 4 MG/ML injection 4 mg (4 mg Intravenous Given 06/14/18 0935)     Initial Impression / Assessment and Plan / ED Course  I have reviewed the triage vital signs and the nursing notes.  Pertinent labs & imaging results that were available during my care of the patient were reviewed by me and considered in my medical decision making (see chart for details).     Patient presenting for evaluation of acute onset, constant chest pain.  He is afebrile, hypertensive in the ED.  Pain improved from 10/10 in severity to 6/10 in severity after aspirin and 2 doses of  sublingual nitroglycerin with EMS.  His EKG shows right  bundle branch block, no ST segment abnormalities. [facility]); they do not have any EKGs on file for the patient so we have no comparison.  His chest x-ray shows no acute cardiopulmonary abnormalities.  Initial troponin is negative.  However, with a HEART score of 6, patient is high risk for cardiac disease and his story is concerning.  Will consult cardiology.  Pain continues to improve with additional doses of nitroglycerin and a dose of morphine in the ED. He is hemodynamically stable at this time.   12:00PM Delta trop elevated. Concern for STEMI. Repeat EKG stable. Patient's pain reduced to 3/10 but then worsened to 6/10 on re-evaluation. Will initiate heparin gtt and nitro gtt. doubt dissection, PE, cardiac tamponade, pneumonia, or esophageal rupture.  Cardiology service to admit.  Plan for catheterization.   Final Clinical Impressions(s) / ED Diagnoses   Final diagnoses:  NSTEMI (non-ST elevated myocardial infarction) [facility](HCC)    ED Discharge Orders    None       Bennye AlmFawze, Larinda Herter A, PA-C 06/14/18 1343    Gwyneth SproutPlunkett, Whitney, MD 06/16/18 [phone]

## 2018-06-14 NOTE — H&P (Addendum)
Cardiology Admission History and Physical:   Patient ID: Robert Kerr MRN: [id number]; DOB: [birth year]   Admission date: 06/14/2018  Primary Care Provider: [name], [name], PA Primary Cardiologist: No primary care provider on file. Irving ShowsNew Mamta Rimmer Primary Electrophysiologist:  None   Chief Complaint:  Chest pain  Patient Profile:   Robert NeverBradford Kerr is a 48 y.o. male with PMH of NIDDM, HTN, GERD and diverticulitis who presented with chest pain   History of Present Illness:   Mr. Robert Kerr is a 48 yo male with PMH of NIDDM, HTN, GERD and diverticulitis. He is followed regularly by his PCP and has been diabetic for the past 5 years. Non-smoker. Strong family hx of CAD with father having his first MI at the age of 47, and 3 subsequent MIs to follow. Sounds like his brother had balloon angioplasty in his 2750s. Sister with valve disease and replacement. He currently works as a Sports coachsafety inspector, usually on his feet and outside often. Has not experienced chest pain or shortness of breath with his usual activities.   Yesterday was at his PCP office for a routine visit. Had labs done and a good report. States shortly after he left he developed centralized chest pain with burning. He had been fastin that morning so assumed this was related to hunger/GERD and took Rolaids. Symptoms subsided after about 20 minutes. He went to work and came home. Today he got up and did his usual routine, took morning medications. Around 930 he began to experience this chest discomfort again. This time had radiation of pressure, along with numbness/tingling in his arms bilaterally. States it feels like he has been lifting weights. Also with a pressure sensation behind hi sternum. Informed his wife who called EMS.   He was given SL nitro and ASA PTA. Reports the nitro significantly improved his pain. In the ED his labs showed stable electrolytes, Trop 0.02>>0.10. EKG showed SR without ischemia. He was also given morphine in the ED  which helped with pain, but was returning at the time of exam.    Past Medical History:  Diagnosis Date  . Diabetes mellitus without complication (HCC)   . Diverticulitis   . GERD (gastroesophageal reflux disease)   . Hypertension     History reviewed. No pertinent surgical history.   Medications Prior to Admission: Prior to Admission medications   Medication Sig Start Date End Date Taking? Authorizing Provider  DM-Doxylamine-Acetaminophen (NYQUIL HBP COLD & FLU) 15-6.25-325 MG/15ML LIQD Take 15 mLs by mouth every 4 (four) hours as needed (cold symptoms).   Yes [provider]  FARXIGA 10 MG TABS tablet Take 10 mg by mouth daily. 06/13/18  Yes [provider]  Guaifenesin 1200 MG TB12 Take 1 tablet by mouth every 12 (twelve) hours as needed (congestion).   Yes [provider]  loratadine (CLARITIN) 10 MG tablet Take 10 mg by mouth daily.   Yes [provider]  Olmesartan-amLODIPine-HCTZ 20-5-12.5 MG TABS Take 1 tablet by mouth daily. 06/13/18  Yes [provider]  omeprazole (PRILOSEC) 20 MG capsule Take 20 mg by mouth daily. 06/13/18  Yes [provider]  Probiotic Product (PROBIOTIC-10 ULTIMATE) CAPS Take 1 tablet by mouth daily.   Yes [provider]  Pseudoeph-Doxylamine-DM-APAP (NYQUIL MULTI-SYMPTOM PO) Take 2 capsules by mouth every 6 (six) hours as needed (cold symptoms).   Yes [provider]  valACYclovir (VALTREX) 1000 MG tablet Take 1 g by mouth as needed. Cold sores 06/13/18  Yes [provider]  Vitamin  D, Ergocalciferol, (DRISDOL) 1.25 MG (50000 UT) CAPS capsule Take 50,000 Units by mouth 2 (two) times a week. Monday, Wednesday 06/13/18   [provider]     Allergies:    Allergies  Allergen Reactions  . Demerol [Meperidine Hcl]   . Erythromycin Rash  . Penicillins Rash    Happened during childhood    Social History:   Social History   Socioeconomic History  . Marital status:  Unknown    Spouse name: Not on file  . Number of children: Not on file  . Years of education: Not on file  . Highest education level: Not on file  Occupational History  . Not on file  Social Needs  . Financial resource strain: Not on file  . Food insecurity:    Worry: Not on file    Inability: Not on file  . Transportation needs:    Medical: Not on file    Non-medical: Not on file  Tobacco Use  . Smoking status: Never Smoker  . Smokeless tobacco: Never Used  Substance and Sexual Activity  . Alcohol use: Not Currently  . Drug use: Never  . Sexual activity: Not on file  Lifestyle  . Physical activity:    Days per week: Not on file    Minutes per session: Not on file  . Stress: Not on file  Relationships  . Social connections:    Talks on phone: Not on file    Gets together: Not on file    Attends religious service: Not on file    Active member of club or organization: Not on file    Attends meetings of clubs or organizations: Not on file    Relationship status: Not on file  . Intimate partner violence:    Fear of current or ex partner: Not on file    Emotionally abused: Not on file    Physically abused: Not on file    Forced sexual activity: Not on file  Other Topics Concern  . Not on file  Social History Narrative  . Not on file    Family History:   The patient's family history includes Heart attack in his father; Heart disease in his brother and father.    ROS:  Please see the history of present illness.  All other ROS reviewed and negative.     Physical Exam/Data:   Vitals:   06/14/18 0930 06/14/18 1000 06/14/18 1015 06/14/18 1115  BP: (!) 141/88     Pulse: 100 79 71 79  Resp: 13 12 13 12   Temp:      TempSrc:      SpO2: 95% 97% 97% 99%  Weight:      Height:       No intake or output data in the 24 hours ending 06/14/18 1246 Last 3 Weights 06/14/2018  Weight (lbs) 286 lb  Weight (kg) 129.729 kg     Body mass index is 41.04 kg/m.  General:  Well  nourished, obese, in no acute distress HEENT: normal Lymph: no adenopathy Neck: no JVD Endocrine:  No thryomegaly Vascular: No carotid bruits; FA pulses 2+ bilaterally without bruits  Cardiac:  normal S1, S2; RRR; no murmur  Lungs:  clear to auscultation bilaterally, no wheezing, rhonchi or rales  Abd: soft, nontender, no hepatomegaly  Ext: no edema Musculoskeletal:  No deformities, BUE and BLE strength normal and equal Skin: warm and dry  Neuro:  CNs 2-12 intact, no focal abnormalities noted Psych:  Normal affect  EKG:  The ECG that was done in the ED 2/12 was personally reviewed and demonstrates SR without ischemia  Relevant CV Studies:   Laboratory Data:  Chemistry Recent Labs  Lab 06/14/18 0826  NA 139  K 4.5  CL 105  CO2 25  GLUCOSE 183*  BUN 11  CREATININE 1.00  CALCIUM 9.4  GFRNONAA >60  GFRAA >60  ANIONGAP 9    No results for input(s): PROT, ALBUMIN, AST, ALT, ALKPHOS, BILITOT in the last 168 hours. Hematology Recent Labs  Lab 06/14/18 0826  WBC 9.1  RBC 5.48  HGB 15.4  HCT 46.7  MCV 85.2  MCH 28.1  MCHC 33.0  RDW 12.4  PLT 242   Cardiac EnzymesNo results for input(s): TROPONINI in the last 168 hours.  Recent Labs  Lab 06/14/18 0841 06/14/18 1200  TROPIPOC 0.02 0.10*    BNPNo results for input(s): BNP, PROBNP in the last 168 hours.  DDimer No results for input(s): DDIMER in the last 168 hours.  Radiology/Studies:  Dg Chest 2 View  Result Date: 06/14/2018 CLINICAL DATA:  Chest pain EXAM: CHEST - 2 VIEW COMPARISON:  None. FINDINGS: Lungs are clear. Heart size and pulmonary vascularity are normal. No adenopathy. No bone lesions. No pneumothorax. IMPRESSION: No edema or consolidation. Electronically Signed   By: Bretta Bang III M.D.   On: 06/14/2018 09:03    Assessment and Plan:   Moise Malzahn is a 48 y.o. male with PMH of NIDDM, HTN, GERD and diverticulitis who presented with chest pain   1. NSTEMI: symptoms concerning for ACS.  Initial POC trop 0.02>>0.10. Pain improved with SL nitro and morphine but returning. EKG without acute ischemia. -- admit to inpatient -- nitro gtt and heparin gtt -- plan for cardiac cath -- The patient understands that risks included but are not limited to stroke (1 in 1000), death (1 in 1000), kidney failure [usually temporary] (1 in 500), bleeding (1 in 200), allergic reaction [possibly serious] (1 in 200).   2. HTN: on home combination ARB/HCTZ -- add BB  3. NIDDM: hold home medications -- Hgb A1c -- SSI while inpatient  4. GERD: PO Protonix   Severity of Illness: The appropriate patient status for this patient is INPATIENT. Inpatient status is judged to be reasonable and necessary in order to provide the required intensity of service to ensure the patient's safety. The patient's presenting symptoms, physical exam findings, and initial radiographic and laboratory data in the context of their chronic comorbidities is felt to place them at high risk for further clinical deterioration. Furthermore, it is not anticipated that the patient will be medically stable for discharge from the hospital within 2 midnights of admission. The following factors support the patient status of inpatient.   " The patient's presenting symptoms include chest pain with bilateral arm pain. " The worrisome physical exam findings include stable exam. " The initial radiographic and laboratory data are worrisome because of + trop. " The chronic co-morbidities include HTN, DM.   * I certify that at the point of admission it is my clinical judgment that the patient will require inpatient hospital care spanning beyond 2 midnights from the point of admission due to high intensity of service, high risk for further deterioration and high frequency of surveillance required.*    For questions or updates, please contact [facility] Please consult [email or web address] for contact info under     Signed, Laverda Page,  NP  06/14/2018 12:46 PM   I have examined the  patient and reviewed assessment and plan and discussed with patient.  Agree with above as stated.   Patient with strong family history of premature coronary artery disease.  He has multiple risk factors for CAD.  He has recurrent chest discomfort.  We will start IV nitroglycerin.  We will plan for cardiac catheterization.  All questions about the procedure answered.  He is agreeable.  Cardiac catheterization was discussed with the patient fully. The patient understands that risks include but are not limited to stroke (1 in 1000), death (1 in 1000), kidney failure [usually temporary] (1 in 500), bleeding (1 in 200), allergic reaction [possibly serious] (1 in 200).  The patient understands and is willing to proceed.     Lance MussJayadeep Daiana Vitiello

## 2018-06-15 ENCOUNTER — Encounter (HOSPITAL_COMMUNITY): Payer: Self-pay | Admitting: Interventional Cardiology

## 2018-06-15 DIAGNOSIS — I10 Essential (primary) hypertension: Secondary | ICD-10-CM

## 2018-06-15 DIAGNOSIS — K219 Gastro-esophageal reflux disease without esophagitis: Secondary | ICD-10-CM

## 2018-06-15 DIAGNOSIS — E785 Hyperlipidemia, unspecified: Secondary | ICD-10-CM

## 2018-06-15 DIAGNOSIS — E782 Mixed hyperlipidemia: Secondary | ICD-10-CM

## 2018-06-15 LAB — BASIC METABOLIC PANEL
Anion gap: 11 (ref 5–15)
BUN: 10 mg/dL (ref 6–20)
CO2: 26 mmol/L (ref 22–32)
Calcium: 8.8 mg/dL — ABNORMAL LOW (ref 8.9–10.3)
Chloride: 103 mmol/L (ref 98–111)
Creatinine, Ser: 1.05 mg/dL (ref 0.61–1.24)
GFR calc Af Amer: >60 mL/min (ref >60)
GFR calc non Af Amer: >60 mL/min (ref >60)
Glucose, Bld: 114 mg/dL — ABNORMAL HIGH (ref 70–99)
Potassium: 4.2 mmol/L (ref 3.5–5.1)
Sodium: 140 mmol/L (ref 135–145)

## 2018-06-15 LAB — LIPID PANEL
Cholesterol: 135 mg/dL (ref 0–200)
HDL: 26 mg/dL — ABNORMAL LOW (ref >40)
LDL Cholesterol: 74 mg/dL (ref 0–99)
Total CHOL/HDL Ratio: 5.2 RATIO
Triglycerides: 176 mg/dL — ABNORMAL HIGH (ref <150)
VLDL: 35 mg/dL (ref 0–40)

## 2018-06-15 LAB — CBC
HCT: 40.9 % (ref 39.0–52.0)
Hemoglobin: 13.4 g/dL (ref 13.0–17.0)
MCH: 28 pg (ref 26.0–34.0)
MCHC: 32.8 g/dL (ref 30.0–36.0)
MCV: 85.6 fL (ref 80.0–100.0)
NRBC: 0 % (ref 0.0–0.2)
Platelets: 235 10*3/uL (ref 150–400)
RBC: 4.78 MIL/uL (ref 4.22–5.81)
RDW: 12.7 % (ref 11.5–15.5)
WBC: 9.6 10*3/uL (ref 4.0–10.5)

## 2018-06-15 LAB — HIV ANTIBODY (ROUTINE TESTING W REFLEX): HIV Screen 4th Generation wRfx: NONREACTIVE

## 2018-06-15 LAB — TROPONIN I
Troponin I: 6.12 ng/mL (ref <0.03)
Troponin I: 6.64 ng/mL (ref <0.03)

## 2018-06-15 MED ORDER — TICAGRELOR 90 MG PO TABS: 90.0000 mg | ORAL_TABLET | Freq: Two times a day (BID) | ORAL | 11 refills | Status: DC

## 2018-06-15 MED ORDER — METOPROLOL TARTRATE 25 MG PO TABS: 25.0000 mg | ORAL_TABLET | Freq: Two times a day (BID) | ORAL | 11 refills | Status: DC

## 2018-06-15 MED ORDER — ASPIRIN 81 MG PO TBEC: 81.0000 mg | DELAYED_RELEASE_TABLET | Freq: Every day | ORAL | 3 refills | Status: DC

## 2018-06-15 MED ORDER — NITROGLYCERIN 0.4 MG SL SUBL: 0.4000 mg | SUBLINGUAL_TABLET | SUBLINGUAL | 1 refills | Status: DC | PRN

## 2018-06-15 MED ORDER — ATORVASTATIN CALCIUM 80 MG PO TABS: 80.0000 mg | ORAL_TABLET | Freq: Every day | ORAL | 11 refills | Status: DC

## 2018-06-15 MED ORDER — METOPROLOL TARTRATE 25 MG PO TABS: 25.0000 mg | ORAL_TABLET | Freq: Two times a day (BID) | ORAL | Status: DC

## 2018-06-15 MED FILL — NITROGLYCERIN 0.4 MG TAB SL: 0.4 | 8 days supply | Qty: 25 | Fill #0 | Status: TO

## 2018-06-15 MED FILL — BRILINTA 90 MG TABLET: 90 | 30 days supply | Qty: 60 | Fill #0 | Status: TO

## 2018-06-15 MED FILL — METOPROLOL TARTRATE 25 MG T: 25 | 30 days supply | Qty: 60 | Fill #0 | Status: TO

## 2018-06-15 MED FILL — ATORVASTATIN CALCIUM 80 MG: 80 | 30 days supply | Qty: 30 | Fill #0 | Status: TO

## 2018-06-15 MED FILL — ASPIRIN LOW DOSE 81 MG TBEC: 81 | 90 days supply | Qty: 90 | Fill #0 | Status: TO

## 2018-06-15 NOTE — Progress Notes (Signed)
CARDIAC REHAB PHASE I   PRE:  Rate/Rhythm: 96 SR  BP:  Supine:   Sitting: 164/108  Standing:    SaO2: 99 RA  MODE:  Ambulation: 680 ft   POST:  Rate/Rhythm: 93 SR  BP:  Supine:   Sitting: 173/111  Standing:    SaO2: 99 RA  1055- 1205  Pt assisted with ambulation 680 ft independently with no assistive devices. Pt experienced no CP of SOB. Post walk BP elevated to highest 180/118. RN Dawn made aware. Educated pt and wife on MI booklet, use of NTG, heart healthy and diabetic diets, and stressed importance of anti-platelet therapy of ASA and Brilinta. Exercise suggestions given and restrictions for MI and heart cath via wrist site discussed. Pt verbalized understanding. CRPII referral order placed and to be sent to [facility].  1055- 1205  Lyda Kalata RN, BSN 06/15/2018 11:56 AM

## 2018-06-15 NOTE — Plan of Care (Signed)
Patient is progressing toward all care goals.  Right radial site is at Level 0; no complications.  Aggrastat has now been discontinued.  Patient is expected to receive education and initiate cardiac rehab later today.  Wife at bedside when physician rounded - all questions answered w/verbal understanding per patient and wife.  Patient is in ST per monitor - low 100s.  Mild to moderate pain in RUE - medicated per PRN pain meds and physician aware.  Will continue to monitor and work toward discharge either later today or tomorrow.

## 2018-06-15 NOTE — Discharge Instructions (Signed)
Acute Coronary Syndrome    Acute coronary syndrome (ACS) is a serious problem in which there is suddenly not enough blood and oxygen reaching the heart. ACS can result in chest pain or a heart attack.  This condition is a medical emergency. If you have any symptoms of this condition, get help right away.  What are the causes?  This condition may be caused by:   Buildup of fat and cholesterol inside of the arteries (atherosclerosis). This is the most common cause. The buildup (plaque) can cause blood vessels in the heart (coronary arteries) to become narrow or blocked, which reduces blood flow to the heart. Plaque can also break off and lead to a clot, which can block an artery and cause a heart attack or stroke.   Sudden tightening of the muscles around the coronary arteries (coronary spasm).   Tearing of a coronary artery (spontaneous coronary artery dissection).   Very low blood pressure (hypotension).   An abnormal heartbeat (arrhythmia).   Other medical conditions that cause a decrease of oxygen to the heart, such as anemiaorrespiratory failure.   Using cocaine or methamphetamine.  What increases the risk?  The following factors may make you more likely to develop this condition:   Age. The risk for ACS increases as you get older.   History of chest pain, heart attack, peripheral artery disease, or stroke.   Having taken chemotherapy or immune-suppressing medicines.   Being male.   Family history of chest pain, heart disease, or stroke.   Smoking.   Not exercising enough.   Being overweight.   High cholesterol.   High blood pressure (hypertension).   Diabetes.   Excessive alcohol use.  What are the signs or symptoms?  Common symptoms of this condition include:   Chest pain. The pain may last a long time, or it may stop and come back (recur). It may feel like:  ? Crushing or squeezing.  ? Tightness, pressure, fullness, or heaviness.   Arm, neck, jaw, or back pain.   Heartburn or  indigestion.   Shortness of breath.   Nausea.   Sudden cold sweats.   Light-headedness.   Dizziness, or passing out.   Tiredness (fatigue).  Sometimes there are no symptoms.  How is this diagnosed?  This condition may be diagnosed based on:   Your medical history and symptoms.   An electrocardiogram (ECG). This imaging test measures the heart's electrical activity.   Blood tests. Cardiac blood tests may need to be repeated at designated time intervals.   Chest X-ray.   A CT scan of the chest.   A coronary angiogram. This is a procedure in which dye is injected into the bloodstream and then X-rays are taken to show if there is a blockage in a coronary artery.   Exercise stress testing.   Echocardiography. This is a test that uses sound waves to produce detailed images of the heart.  How is this treated?  The treatment is to restore blood flow to the heart as soon as possible. Treatment for this condition may include:   Oxygen therapy.   Medicines, such as:  ? Antiplatelet medicines and blood-thinning medicines, such as aspirin. These help prevent blood clots.  ? Medicine that dissolves any blood clots (fibrinolytic therapy).  ? Blood pressure medicines.  ? Nitroglycerin. This helps relieve chest pain and widens blood vessels to improve blood flow.  ? Pain medicine.  ? Cholesterol-lowering medicine.   Surgery, such as:  ? Coronary angioplasty with   stent placement. This involves placing a small piece of metal that looks like mesh or a spring into a narrow coronary artery. This widens the artery and keep it open.  ? Coronary artery bypass surgery. This involves taking a section of a blood vessel from a different part of your body, and placing it on the blocked coronary artery to allow blood to flow around (bypass) the blockage.   Cardiac rehabilitation. This is a program that helps improve your health and well-being. It includes exercise training, education, and counseling to help you recover.  Follow  these instructions at home:  Eating and drinking   Eat a heart-healthy diet that includes whole grains, fruits and vegetables, lean proteins, and low-fat or nonfat dairy products.   Limit how much salt (sodium) you eat as told by your health care provider. Follow instructions from your health care provider about any other eating or drinking restrictions, such as limiting foods that are high in fat and processed sugars.   Use healthy cooking methods such as roasting, grilling, broiling, baking, poaching, steaming, or stir-frying.   Talk with a dietitian to learn about healthy cooking methods and how to eat less sodium.  Medicines   Take over-the-counter and prescription medicines only as told by your health care provider.   Do not take these medicines unless your health care provider approves:  ? Vitamin supplements that contain vitamin A or vitamin E.  ? Nonsteroidal anti-inflammatory drugs (NSAIDs), such as ibuprofen, naproxen, or celecoxib.  ? Hormone replacement therapy that contains estrogen.  If you are taking blood thinners:   Talk with your health care provider before you take any medicines that contain aspirin or NSAIDs. These medicines increase your risk for dangerous bleeding.   Take your medicine exactly as told, at the same time every day.   Avoid activities that could cause injury or bruising, and follow instructions about how to prevent falls.   Wear a medical alert bracelet, and carry a card that lists what medicines you take.  Activity   Join a cardiac rehabilitation program. An exercise plan will be developed for you.   Ask your health care provider:  ? What activities and exercises are safe for you.  ? If you should follow specific instructions about lifting, driving, or climbing stairs.  Lifestyle   Do not use any products that contain nicotine or tobacco, such as cigarettes and e-cigarettes. If you need help quitting, ask your health care provider.   If your health care provider  says that alcohol is safe for you, limit your alcohol intake to no more than 1 drink a day. One drink equals 12 oz of beer, 5 oz of wine, or 1 oz of hard liquor.   Maintain a healthy weight. If you need to lose weight, work with your health care provider to do so safely.  General instructions   Tell all the health care providers who care for you about your heart condition, including your dentist. This may affect the medicines or treatment you receive.   Manage any other health conditions you have, such as hypertension or diabetes. These conditions affect your heart.   Learn ways to manage stress.   Get screened for depression, and get mental health treatment if you need it. People with ACS are at higher risk for depression.   Keep your vaccinations up to date. Get the flu shot (influenza vaccine) every year.   If directed, monitor your blood pressure at home.   Keep all   follow-up visits as told by your health care provider. This is important.  Contact a health care provider if:   You feel overwhelmed or sad.   You have trouble doing your daily activities.  Get help right away if:   You have pain in your chest, neck, arm, jaw, stomach, or back that recurs, and:  ? It lasts for more than a few minutes.  ? It is not relieved by taking the medicineyour health care provider prescribed.   You have unexplained:  ? Heavy sweating.  ? Heartburn or indigestion.  ? Nausea or vomiting.  ? Shortness of breath.  ? Difficulty breathing.  ? Fatigue.  ? Nervousness or anxiety.  ? Weakness.  ? Diarrhea.  ? Dark stools or blood in your stool.   You have sudden light-headedness or dizziness.   Your blood pressure is higher than 180/120.   You faint.   You have thoughts about hurting yourself.  These symptoms may represent a serious problem that is an emergency. Do not wait to see if the symptoms will go away. Get medical help right away. Call your local emergency services (911 in the [place] Do not drive yourself to the  hospital.  If you ever feel like you may hurt yourself or others, or have thoughts about taking your own life, get help right away. You can go to your nearest emergency department or call:   Emergency services (911 in the [place]   A suicide crisis helpline, such as the National Suicide Prevention Lifeline at 1-800-273-8255. This is open 24 hours a day.  Summary   Acute coronary syndrome (ACS) is when there is not enough blood and oxygen being supplied to the heart. ACS can result in chest pain or a heart attack.   Acute coronary syndrome is a medical emergency. If you have any symptoms of this condition, get help right away.   Treatment includes medicines and procedures to open the blocked arteries and restore blood flow.  This information is not intended to replace advice given to you by your health care provider. Make sure you discuss any questions you have with your health care provider.  Document Released: 04/19/2005 Document Revised: 12/28/2016 Document Reviewed: 12/28/2016  [employer]vier Interactive Patient Education  2019 [employer]

## 2018-06-15 NOTE — Plan of Care (Signed)
Patient understands his medical diagnosis and will be discharged per MD order this afternoon with all discharges given both verbally and in written format.

## 2018-06-15 NOTE — Discharge Summary (Addendum)
Discharge Summary    Patient ID: Robert Kerr,  MRN: [id number], DOB/AGE: [birth year] 48 y.o.  Admit date: 06/14/2018 Discharge date: 06/15/2018  Primary Care Provider: [name], [name] Primary Cardiologist: Dr. Eldridge DaceVaranasi   Discharge Diagnoses    Principal Problem:   NSTEMI (non-ST elevated myocardial infarction) [facility](HCC) Active Problems:   Hypertension   Hyperlipidemia   GERD (gastroesophageal reflux disease)   Allergies Allergies  Allergen Reactions  . Demerol [Meperidine Hcl]   . Erythromycin Rash  . Penicillins Rash    Happened during childhood    Diagnostic Studies/Procedures    Cath: 06/14/2018   Total occlusion of nondominant RCA.  PCI gave impression that RCA was a chronic total occlusion.  Successful angioplasty and stenting reducing a 100% stenosis with TIMI grade 0 flow to 0% with TIMI grade II-III flow.  Procedure complicated by slow flow and embolization into the acute marginal branch of the coronary.  A 2.75 Synergy was postdilated to 3.25 mm in diameter.  Widely patent left main.    LAD wraps around the apex and is widely patent with luminal irregularities noted throughout the mid vessel.  The circumflex coronary artery is codominant giving origin to a very early first obtuse marginal and contains mid vessel 60% narrowing a large branching second obtuse marginal that contains 85% proximal to mid narrowing, and a large PDA and third obtuse marginal branch that are free of significant obstruction.  Normal LV systolic function with EF greater than 50%.  EDP was 12 mmHg.  RECOMMENDATIONS:   It was not clear when the patient initially entered the Cath Lab that he was having ongoing pain.  After initial angiography all territories were investigated visually.  Proceeded to do PCI on the RCA after patient admitted ongoing rest pain.  Despite ongoing pain he was conversant and did not appear to be in very much distress at all.  EKG was nonischemic.  Aggressive  secondary risk modification including high intensity statin therapy.  Consider PCI on the second obtuse marginal branch if recurrent symptoms.  IV Aggrastat x18 hours due to embolic occlusion of the acute marginal branch of the right coronary. _____________   History of Present Illness     Robert Kerr is a 48 yo male with PMH of NIDDM, HTN, GERD and diverticulitis. He is followed regularly by his PCP and has been diabetic for the past 5 years. Non-smoker. Strong family hx of CAD with father having his first MI at the age of 47, and 3 subsequent MIs to follow. Sounded like his brother had balloon angioplasty in his 7250s. Sister with valve disease and replacement. 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 Had not experienced chest pain or shortness of breath with his usual activities.   The day prior to admission was at his PCP office for a routine visit. Had labs done and a good report. Stated shortly after he left he developed centralized chest pain with burning. He had been fasting that morning so assumed this was related to hunger/GERD and took Rolaids. Symptoms subsided after about 20 minutes. He went to work and came home. The day of admission he got up and did his usual routine, took morning medications. Around 930 he began to experience this chest discomfort again. This time had radiation of pressure, along with numbness/tingling in his arms bilaterally. Stated it felt like he had been lifting weights. Also with a pressure sensation behind his sternum. Informed his wife who  called EMS.   He was given SL nitro and ASA PTA. Reported the nitro significantly improved his pain. In the ED his labs showed stable electrolytes, Trop 0.02>>0.10. EKG showed SR without ischemia. He was also given morphine in the ED which helped with pain, but was returning at the time of exam.   Hospital Course     Consultants: None   He was admitted and placed on IV nitroglycerin and  heparin.  Underwent cardiac catheterization noted above with PCI of the RCA.  Noted to have a total occlusion of this nondominant vessel.  Successful angioplasty and stenting with 0 stenosis and TIMI grade II-III flow post procedure.  Procedure was complicated by slow flow and embolization to an acute marginal branch of the RCA.  He was placed on IV Aggrastat for 18 hours post cath given this embolic event.  Plan for Dapt with aspirin and Brilinta for 1 year.  He was continued on his home blood pressure medication regimen with the addition of metoprolol which was titrated up to 25 mg twice daily.  High-dose statin was added.  Troponin peaked at 6.64, LDL noted at 74 with triglycerides 176.  No recurrent chest pain post procedure.  Worked well with cardiac rehab the following morning.  No complications noted post cath.  General: Well developed, well nourished, male appearing in no acute distress. Head: Normocephalic, atraumatic.  Neck: Supple without bruits, JVD. Lungs:  Resp regular and unlabored, CTA. Heart: RRR, S1, S2, no S3, S4, or murmur; no rub. Abdomen: Soft, non-tender, non-distended with normoactive bowel sounds. No hepatomegaly. No rebound/guarding. No obvious abdominal masses. Extremities: No clubbing, cyanosis, edema. Distal pedal pulses are 2+ bilaterally. R radial cath site stable without bruising or hematoma Neuro: Alert and oriented X 3. Moves all extremities spontaneously. Psych: Normal affect.  Robert Kerr was seen by Dr. Eldridge Dace and determined stable for discharge home. Follow up in the office has been arranged. Medications are listed below.   _____________  Discharge Vitals Blood pressure 123/80, pulse 95, temperature 98.1 F (36.7 C), temperature source Oral, resp. rate (!) 22, height 5\' 10"  (1.778 m), weight 129.7 kg, SpO2 97 %.  Filed Weights   06/14/18 0833  Weight: 129.7 kg    Labs & Radiologic Studies    CBC Recent Labs    06/14/18 1907 06/15/18 0705  WBC  12.8* 9.6  HGB 14.0 13.4  HCT 43.3 40.9  MCV 84.1 85.6  PLT 266 235   Basic Metabolic Panel Recent Labs    40/98/11 0826 06/14/18 1907 06/15/18 0705  NA 139  --  140  K 4.5  --  4.2  CL 105  --  103  CO2 25  --  26  GLUCOSE 183*  --  114*  BUN 11  --  10  CREATININE 1.00 0.98 1.05  CALCIUM 9.4  --  8.8*   Liver Function Tests No results for input(s): AST, ALT, ALKPHOS, BILITOT, PROT, ALBUMIN in the last 72 hours. No results for input(s): LIPASE, AMYLASE in the last 72 hours. Cardiac Enzymes Recent Labs    06/14/18 1907 06/14/18 2349 06/15/18 0705  TROPONINI 4.00* 6.12* 6.64*   BNP Invalid input(s): POCBNP D-Dimer No results for input(s): DDIMER in the last 72 hours. Hemoglobin A1C No results for input(s): HGBA1C in the last 72 hours. Fasting Lipid Panel Recent Labs    06/15/18 0705  CHOL 135  HDL 26*  LDLCALC 74  TRIG 914*  CHOLHDL 5.2   Thyroid Function Tests  No results for input(s): TSH, T4TOTAL, T3FREE, THYROIDAB in the last 72 hours.  Invalid input(s): FREET3 _____________  Dg Chest 2 View  Result Date: 06/14/2018 CLINICAL DATA:  Chest pain EXAM: CHEST - 2 VIEW COMPARISON:  None. FINDINGS: Lungs are clear. Heart size and pulmonary vascularity are normal. No adenopathy. No bone lesions. No pneumothorax. IMPRESSION: No edema or consolidation. Electronically Signed   By: Bretta Bang III M.D.   On: 06/14/2018 09:03   Disposition   Pt is being discharged home today in good condition.  Follow-up Plans & Appointments    Follow-up Information    Allayne Butcher, PA-C Follow up on 06/27/2018.   Specialties:  Cardiology, Radiology Why:  at 9am for your follow up appt.  Contact information: [address] [place] [address] [phone]          Discharge Instructions    Amb Referral to Cardiac Rehabilitation   Complete by:  As directed    Referral placed to [facility] in [place]   Diagnosis:   Coronary  Stents NSTEMI     Call MD for:  redness, tenderness, or signs of infection (pain, swelling, redness, odor or green/yellow discharge around incision site)   Complete by:  As directed    Diet - low sodium heart healthy   Complete by:  As directed    Discharge instructions   Complete by:  As directed    Radial Site Care Refer to this sheet in the next few weeks. These instructions provide you with information on caring for yourself after your procedure. Your caregiver may also give you more specific instructions. Your treatment has been planned according to current medical practices, but problems sometimes occur. Call your caregiver if you have any problems or questions after your procedure. HOME CARE INSTRUCTIONS You may shower the day after the procedure.Remove the bandage (dressing) and gently wash the site with plain soap and water.Gently pat the site dry.  Do not apply powder or lotion to the site.  Do not submerge the affected site in water for 3 to 5 days.  Inspect the site at least twice daily.  Do not flex or bend the affected arm for 24 hours.  No lifting over 5 pounds (2.3 kg) for 5 days after your procedure.  Do not drive home if you are discharged the same day of the procedure. Have someone else drive you.  You may drive 24 hours after the procedure unless otherwise instructed by your caregiver.  What to expect: Any bruising will usually fade within 1 to 2 weeks.  Blood that collects in the tissue (hematoma) may be painful to the touch. It should usually decrease in size and tenderness within 1 to 2 weeks.  SEEK IMMEDIATE MEDICAL CARE IF: You have unusual pain at the radial site.  You have redness, warmth, swelling, or pain at the radial site.  You have drainage (other than a small amount of blood on the dressing).  You have chills.  You have a fever or persistent symptoms for more than 72 hours.  You have a fever and your symptoms suddenly get worse.  Your arm becomes pale,  cool, tingly, or numb.  You have heavy bleeding from the site. Hold pressure on the site.   PLEASE DO NOT MISS ANY DOSES OF YOUR BRILINTA!!!!! Also keep a log of you blood pressures and bring back to your follow up appt. Please call the office with any questions.   Patients taking  blood thinners should generally stay away from medicines like ibuprofen, Advil, Motrin, naproxen, and Aleve due to risk of stomach bleeding. You may take Tylenol as directed or talk to your primary doctor about alternatives.   Increase activity slowly   Complete by:  As directed        Discharge Medications     Medication List    STOP taking these medications   Guaifenesin 1200 MG Tb12   NYQUIL HBP COLD & FLU 15-6.25-325 MG/15ML Liqd Generic drug:  DM-Doxylamine-Acetaminophen     TAKE these medications   aspirin 81 MG EC tablet Take 1 tablet (81 mg total) by mouth daily. Start taking on:  June 16, 2018   atorvastatin 80 MG tablet Commonly known as:  LIPITOR Take 1 tablet (80 mg total) by mouth daily at 6 PM.   FARXIGA 10 MG Tabs tablet Generic drug:  dapagliflozin propanediol Take 10 mg by mouth daily.   loratadine 10 MG tablet Commonly known as:  CLARITIN Take 10 mg by mouth daily.   metoprolol tartrate 25 MG tablet Commonly known as:  LOPRESSOR Take 1 tablet (25 mg total) by mouth 2 (two) times daily.   nitroGLYCERIN 0.4 MG SL tablet Commonly known as:  NITROSTAT Place 1 tablet (0.4 mg total) under the tongue every 5 (five) minutes as needed for chest pain.   NYQUIL MULTI-SYMPTOM PO Take 2 capsules by mouth every 6 (six) hours as needed (cold symptoms).   Olmesartan-amLODIPine-HCTZ 20-5-12.5 MG Tabs Take 1 tablet by mouth daily.   omeprazole 20 MG capsule Commonly known as:  PRILOSEC Take 20 mg by mouth daily.   PROBIOTIC-10 ULTIMATE Caps Take 1 tablet by mouth daily.   ticagrelor 90 MG Tabs tablet Commonly known as:  BRILINTA Take 1 tablet (90 mg total) by mouth 2  (two) times daily.   valACYclovir 1000 MG tablet Commonly known as:  VALTREX Take 1 g by mouth as needed. Cold sores   Vitamin D (Ergocalciferol) 1.25 MG (50000 UT) Caps capsule Commonly known as:  DRISDOL Take 50,000 Units by mouth 2 (two) times a week. Monday, Wednesday        Acute coronary syndrome (MI, NSTEMI, STEMI, etc) this admission?: Yes.     AHA/ACC Clinical Performance & Quality Measures: 1. Aspirin prescribed? - Yes 2. ADP Receptor Inhibitor (Plavix/Clopidogrel, Brilinta/Ticagrelor or Effient/Prasugrel) prescribed (includes medically managed patients)? - Yes 3. Beta Blocker prescribed? - Yes 4. High Intensity Statin (Lipitor 40-80mg  or Crestor 20-40mg ) prescribed? - Yes 5. EF assessed during THIS hospitalization? - Yes 6. For EF <40%, was ACEI/ARB prescribed? - Not Applicable (EF >/= 40%) 7. For EF <40%, Aldosterone Antagonist (Spironolactone or Eplerenone) prescribed? - Not Applicable (EF >/= 40%) 8. Cardiac Rehab Phase II ordered (Included Medically managed Patients)? - Yes      Outstanding Labs/Studies   FLP/LFTs in 6 weeks.  Duration of Discharge Encounter   Greater than 30 minutes including physician time.  Signed, Laverda PageLindsay Roberts NP-C 06/15/2018, 2:07 PM   I have examined the patient and reviewed assessment and plan and discussed with patient.  Agree with above as stated.  Doing well post procedure.  Pain is much improved.  Walked with rehab.  DAPT along with secondary prevention.    GEN: Well nourished, well developed, in no acute distress  HEENT: normal  Neck: no JVD, carotid bruits, or masses Cardiac: RRR; no murmurs, rubs, or gallops,no edema  Respiratory:  clear to auscultation bilaterally, normal work of breathing GI: soft, nontender, nondistended, obese  MS: no deformity or atrophy ; no right radial hematoma Skin: warm and dry, no rash Neuro:  Strength and sensation are intact Psych: euthymic mood, full affect  He needs to follow a healthy  diet and try to lose weight.   Lance Muss

## 2018-06-16 ENCOUNTER — Telehealth: Payer: Self-pay | Admitting: Gastroenterology

## 2018-06-16 LAB — HEMOGLOBIN A1C
Hgb A1c MFr Bld: 6.8 % — ABNORMAL HIGH (ref 4.8–5.6)
Mean Plasma Glucose: 148 mg/dL

## 2018-06-16 NOTE — Telephone Encounter (Signed)
Please review previous message and advise (further needs?)

## 2018-06-16 NOTE — Telephone Encounter (Signed)
Pt called in and stated that he was an pt of Dr.Gupta and he wanted let the nurse know that he had an heart attack last week and has been placed on blood thinners. He did not advised of the name of the bt that he has been placed on

## 2018-06-19 ENCOUNTER — Telehealth: Payer: Self-pay | Admitting: Cardiology

## 2018-06-19 NOTE — Telephone Encounter (Signed)
New Message         Pt c/o medication issue:  1. Name of Medication: Atrovastation  2. How are you currently taking this medication (dosage and times per day)? 1 a day  3. Are you having a reaction (difficulty breathing--STAT)? No   4. What is your medication issue? Leg/wrist/shoulder cramps, patient would like a call back concernig this matter.

## 2018-06-19 NOTE — Telephone Encounter (Signed)
Called and spoke with patient-patient informed of MD recommendations; Patient is agreeable with plan of care; Patient verbalized understanding of information/instructions;  Patient was advised to call the office at [phone] if questions/concerns arise;

## 2018-06-19 NOTE — Telephone Encounter (Signed)
I will route this Triage as pt has not been seen in the office yet. Pt has appt 06/27/18 with Boyce Medici, PA. Dr. Krista Blue did d/c from the hospital.

## 2018-06-19 NOTE — Telephone Encounter (Signed)
Patient calling and states that he was prescribed atorvastatin 80 mg QD while in the hospital after his NSTEMI. He states that he is experiencing leg, shoulder, and joint pain. He states that he had been on crestor in the past with the same side effects. Patient wanting to let us know and see if there were other options. Patient has an appointment with Robbie Lis, PA on 06/27/18. Made patient aware that I would forward the information and they can discuss this further at his appointment. Patient denies any additional concerns at this time.

## 2018-06-19 NOTE — Telephone Encounter (Signed)
Thanks for letting us know I hope he is feeling better from the cardiac standpoint Please let him know-to call us if he has any GI problems. Follow-up in the GI clinic when cleared from cardiology

## 2018-06-20 NOTE — Progress Notes (Signed)
Transitions of Care Follow Up Call Note  Robert Kerr is an 48 y.o. male who presented to [facility] on 06/14/2018.  The patient had the following prescriptions filled at [facility]: aspirin, atorvastatin, metoprolol, nitroglycerin, brilinta  Patient was called by pharmacist and HIPAA identifiers were verified. The following questions were asked about the prescriptions filled at [facility]:  Has the patient been experiencing any side effects to the medications prescribed? no Understanding of regimen: good Understanding of indications: good Potential of compliance: good   [x]  Patient's prescriptions filled at the [facility] were transferred to the following pharmacy: [facility] on [address]. In [place] []  Patient unable to be reached after calling three times and prescriptions filled at the [facility] were transferred to preferred pharmacy found within their chart.   Barton Fanny Meshilem Machuca 06/20/2018, 5:39 PM Transitions of Care Pharmacy Hours: Monday - Friday 8:30am to 5:00 PM  Phone - [phone]

## 2018-06-22 NOTE — Telephone Encounter (Signed)
His LDL was 74 at time of hospitalization. Given his history, we recommend LDL be less than 70, which is not that far to go. We can try reducing dose down to 40 mg nightly. That should still get him to goal and may result in less side effects.

## 2018-06-23 MED ORDER — ATORVASTATIN CALCIUM 80 MG PO TABS: 40.0000 mg | ORAL_TABLET | Freq: Every day | ORAL | 11 refills | Status: DC

## 2018-06-23 NOTE — Addendum Note (Signed)
Addended by: Daleen Bo I on: 06/23/2018 11:27 AM   Modules accepted: Orders

## 2018-06-23 NOTE — Telephone Encounter (Signed)
Called and spoke to patient and made him aware of information below and that [place], [place] was okay with him reducing his atorvastatin to 40 mg QD. Patient verbalized understanding and will keep appointment on 06/27/18.

## 2018-06-27 ENCOUNTER — Encounter: Payer: Self-pay | Admitting: Cardiology

## 2018-06-27 ENCOUNTER — Encounter (INDEPENDENT_AMBULATORY_CARE_PROVIDER_SITE_OTHER): Payer: Self-pay

## 2018-06-27 ENCOUNTER — Ambulatory Visit: Payer: Managed Care, Other (non HMO) | Admitting: Cardiology

## 2018-06-27 VITALS — BP 124/86 | HR 93 | Ht 70.0 in | Wt 278.8 lb

## 2018-06-27 DIAGNOSIS — I10 Essential (primary) hypertension: Secondary | ICD-10-CM

## 2018-06-27 DIAGNOSIS — E785 Hyperlipidemia, unspecified: Secondary | ICD-10-CM

## 2018-06-27 DIAGNOSIS — I2510 Atherosclerotic heart disease of native coronary artery without angina pectoris: Secondary | ICD-10-CM | POA: Diagnosis not present

## 2018-06-27 DIAGNOSIS — Z9861 Coronary angioplasty status: Secondary | ICD-10-CM

## 2018-06-27 MED ORDER — METOPROLOL TARTRATE 25 MG PO TABS: 37.5000 mg | ORAL_TABLET | Freq: Two times a day (BID) | ORAL | 3 refills | Status: DC

## 2018-06-27 NOTE — Progress Notes (Signed)
06/27/2018 Robert Kerr   [birth year]  [id number]  Primary Physician [name], PA Primary Cardiologist: Lance Muss, MD  Electrophysiologist: None   Reason for Visit/CC: [facility] f/u for CAD s/p NSTEMI   HPI:  Robert Kerr presents to clinic today for [facility] post hospital f/u.   [name] a 48 yo male with PMH of NIDDM, HTN, GERD and diverticulitis. He is followed regularly by his PCP and has been diabetic for the past 5 years. Non-smoker. Strong family hx of CAD with father having his first MI at the age of 24, and 3 subsequent MIs to follow. Sounded like his brother had balloon angioplasty in his 57s. Sister with valve disease and replacement. 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   He presented to [facility] on 06/14/18 with SSCP c/w unstable angina.  In the ED his labs showed stable electrolytes, Trop 0.02>>0.10. EKG showed SR without ischemia. He underwent cardiac catheterization and was found to have an occluded RCA. Noted to have a total occlusion of this nondominant vessel.  Successful angioplasty and stenting with 0 stenosis and TIMI grade II-III flow post procedure.  Procedure was complicated by slow flow and embolization to an acute marginal branch of the RCA.  Also noted to have residual 60% stenosis in the OM1 and 85% stenosis in the OM2 and mild 30% proximal to mid LAD, to be treated medically. He was placed on IV Aggrastat for 18 hours post cath given this embolic event.  Echo was not done, but LV gram at time of cath showed normal LVEF at 55-60%. Plan for DAPT with aspirin and Brilinta for 1 year.  He was continued on his home blood pressure medication regimen with the addition of metoprolol which was titrated up to 25 mg twice daily.  High-dose statin was added.  Troponin peaked at 6.64, LDL noted at 74 with triglycerides 176.  No recurrent chest pain post procedure.  Worked well with cardiac rehab the following morning.  No  complications noted post cath. It was noted by internationalist to consider PCI of the obtuse marginal branch, if he has recurrent angina.   He presents to clinic today for post hospital f/u.  He is here today with his wife.  He reports that he is done fairly well from a cardiac standpoint since discharge from the hospital.  He denies any recurrent anginal symptomatology.  No exertional chest pain or dyspnea.  He reports full medication compliance with dual antiplatelet therapy.  He denies any abnormal bleeding.  He is also been fully compliant with the statin, Lipitor 80 but he does complain of the development of myalgias after starting Lipitor.  He is asking whether or not he can decrease dose down to 40 mg daily.  He has been keeping regular track of his blood pressure and heart rate at home.  Systolic blood pressures have ranged in the 130s to low 140s and diastolic pressures have ranged in the 90s to low 100s.  His pulse rates have been elevated in the 90s to low 100s resting.    Cardiac Studies   Cath: 06/14/2018   Total occlusion of nondominant RCA. PCI gave impression that RCA was a chronic total occlusion.  Successful angioplasty and stenting reducing a 100% stenosis with TIMI grade 0 flow to 0% with TIMI grade II-III flow. Procedure complicated by slow flow and embolization into the acute marginal branch of the coronary. A 2.75 Synergy was postdilated to 3.25  mm in diameter.  Widely patent left main.   LAD wraps around the apex and is widely patent with luminal irregularities noted throughout the mid vessel.  The circumflex coronary artery is codominant giving origin to a very early first obtuse marginal and contains mid vessel 60% narrowing a large branching second obtuse marginal that contains 85% proximal to mid narrowing, and a large PDA and third obtuse marginal branch that are free of significant obstruction.  Normal LV systolic function with EF greater than 50%. EDP was  12 mmHg.  RECOMMENDATIONS:   It was not clear when the patient initially entered the Cath Lab that he was having ongoing pain. After initial angiography all territories were investigated visually. Proceeded to do PCI on the RCA after patient admitted ongoing rest pain. Despite ongoing pain he was conversant and did not appear to be in very much distress at all. EKG was nonischemic.  Aggressive secondary risk modification including high intensity statin therapy.  Consider PCI on the second obtuse marginal branch if recurrent symptoms.  IV Aggrastat x18 hours due to embolic occlusion of the acute marginal branch of the right coronary.   Coronary Diagrams   Diagnostic  Dominance: Right    Intervention      Current Meds  Medication Sig  . aspirin EC 81 MG EC tablet Take 1 tablet (81 mg total) by mouth daily.  Marland Kitchen atorvastatin (LIPITOR) 80 MG tablet Take 0.5 tablets (40 mg total) by mouth daily at 6 PM.  . FARXIGA 10 MG TABS tablet Take 10 mg by mouth daily.  Marland Kitchen loratadine (CLARITIN) 10 MG tablet Take 10 mg by mouth daily.  . nitroGLYCERIN (NITROSTAT) 0.4 MG SL tablet Place 1 tablet (0.4 mg total) under the tongue every 5 (five) minutes as needed for chest pain.  . Olmesartan-amLODIPine-HCTZ 20-5-12.5 MG TABS Take 1 tablet by mouth daily.  Marland Kitchen omeprazole (PRILOSEC) 20 MG capsule Take 20 mg by mouth daily.  . Probiotic Product (PROBIOTIC-10 ULTIMATE) CAPS Take 1 tablet by mouth daily.  . Pseudoeph-Doxylamine-DM-APAP (NYQUIL MULTI-SYMPTOM PO) Take 2 capsules by mouth every 6 (six) hours as needed (cold symptoms).  . ticagrelor (BRILINTA) 90 MG TABS tablet Take 1 tablet (90 mg total) by mouth 2 (two) times daily.  . valACYclovir (VALTREX) 1000 MG tablet Take 1 g by mouth as needed. Cold sores  . Vitamin D, Ergocalciferol, (DRISDOL) 1.25 MG (50000 UT) CAPS capsule Take 50,000 Units by mouth 2 (two) times a week. Monday, Wednesday  . [DISCONTINUED] metoprolol tartrate (LOPRESSOR) 25 MG  tablet Take 1 tablet (25 mg total) by mouth 2 (two) times daily.   Allergies  Allergen Reactions  . Demerol [Meperidine Hcl]   . Erythromycin Rash  . Penicillins Rash    Happened during childhood   Past Medical History:  Diagnosis Date  . Diabetes mellitus without complication (HCC)   . Diverticulitis   . GERD (gastroesophageal reflux disease)   . Hypertension    Family History  Problem Relation Age of Onset  . Heart disease Father   . Heart attack Father   . Heart disease Brother    Past Surgical History:  Procedure Laterality Date  . CORONARY STENT INTERVENTION N/A 06/14/2018   Procedure: CORONARY STENT INTERVENTION;  Surgeon: Lyn Records, MD;  Location: [facility] INVASIVE CV LAB;  Service: Cardiovascular;  Laterality: N/A;  . LEFT HEART CATH AND CORONARY ANGIOGRAPHY N/A 06/14/2018   Procedure: LEFT HEART CATH AND CORONARY ANGIOGRAPHY;  Surgeon: Lyn Records, MD;  Location: [facility]  INVASIVE CV LAB;  Service: Cardiovascular;  Laterality: N/A;   Social History   Socioeconomic History  . Marital status: Unknown    Spouse name: Not on file  . Number of children: Not on file  . Years of education: Not on file  . Highest education level: Not on file  Occupational History  . Not on file  Social Needs  . Financial resource strain: Not on file  . Food insecurity:    Worry: Not on file    Inability: Not on file  . Transportation needs:    Medical: Not on file    Non-medical: Not on file  Tobacco Use  . Smoking status: Never Smoker  . Smokeless tobacco: Never Used  Substance and Sexual Activity  . Alcohol use: Not Currently  . Drug use: Never  . Sexual activity: Not on file  Lifestyle  . Physical activity:    Days per week: Not on file    Minutes per session: Not on file  . Stress: Not on file  Relationships  . Social connections:    Talks on phone: Not on file    Gets together: Not on file    Attends religious service: Not on file    Active member of club or  organization: Not on file    Attends meetings of clubs or organizations: Not on file    Relationship status: Not on file  . Intimate partner violence:    Fear of current or ex partner: Not on file    Emotionally abused: Not on file    Physically abused: Not on file    Forced sexual activity: Not on file  Other Topics Concern  . Not on file  Social History Narrative  . Not on file     Lipid Panel     Component Value Date/Time   CHOL 135 06/15/2018 0705   TRIG 176 (H) 06/15/2018 0705   HDL 26 (L) 06/15/2018 0705   CHOLHDL 5.2 06/15/2018 0705   VLDL 35 06/15/2018 0705   LDLCALC 74 06/15/2018 0705    Review of Systems: General: negative for chills, fever, night sweats or weight changes.  Cardiovascular: negative for chest pain, dyspnea on exertion, edema, orthopnea, palpitations, paroxysmal nocturnal dyspnea or shortness of breath Dermatological: negative for rash Respiratory: negative for cough or wheezing Urologic: negative for hematuria Abdominal: negative for nausea, vomiting, diarrhea, bright red blood per rectum, melena, or hematemesis Neurologic: negative for visual changes, syncope, or dizziness All other systems reviewed and are otherwise negative except as noted above.   Physical Exam:  Blood pressure 124/86, pulse 93, height  (1.778 m), weight 278 lb 12.8 oz (126.5 kg), SpO2 96 %.  General appearance: alert, cooperative and no distress Neck: no carotid bruit and no JVD Lungs: clear to auscultation bilaterally Heart: regular rate and rhythm, S1, S2 normal, no murmur, click, rub or gallop Extremities: extremities normal, atraumatic, no cyanosis or edema Pulses: 2+ and symmetric Skin: Skin color, texture, turgor normal. No rashes or lesions Neurologic: Grossly normal  EKG not performed-- personally reviewed   ASSESSMENT AND PLAN:   1. CAD: s/p recent NSTEMI. Culprit was total occluded RCA treated w/PCI + DES. Also residual 60% OM1, 85% OM 2 and 30% prox to  mid LAD disease, treated medically. It has been recommended to consider PCI of the OM2 if he has any recurrent angina despite medical therapy. EF was normal by LV gram.  He is done well since hospital discharge.  He denies  any recurrent anginal symptomatology.  We will plan to continue dual antiplatelet therapy with aspirin and Brilinta for minimum of 12 months along with beta-blocker and statin therapy.  2. HLD: Lipid panel at time of MI showed LDL at 74 mg/dL. TG were 176 mg/dL. High intensity statin was initiated, Lipitor 80 mg, however he has developed some side effects w/ lower extremity myalgias.  We will try reducing dose of Lipitor down his 40 mg nightly. He will need repeat FLP and HFTs in 4 more weeks. LDL goal is < 70 mg/dL.   3. HTN: Controlled in clinic this morning however the patient has had mildly elevated blood pressures at home with systolic blood pressures ranging from the 130s to low 140s and diastolic blood pressures ranging from the 90s to low 100s.  His resting pulse rates have also been elevated in the 90s to low 100s.  We will plan to further increase the dose of his metoprolol to 37.5 mg twice daily.  We will keep all other medications the same.  He will continue to monitor his blood pressure and pulse rates at home.  Given diabetes, goal blood pressure is less than 130/80.  Will like to get his resting heart rate lower in the 70s to 80s range.  We also discussed lifestyle modification including regular physical activity and low-fat, low-sodium, heart healthy diet.  4. NIDDM: managed by PCP. Good control. Recent Hgb A1c was 6.8.    Follow-Up w/ Dr. Eldridge Dace in 3-4 months.   Mikaella Escalona Delmer Islam, M[facility] 06/27/2018 12:27 PM

## 2018-06-27 NOTE — Patient Instructions (Signed)
Medication Instructions:  START ATORVASTATIN 40 mg DAILY INCREASE METOPROLOL TARTRATE 37.5mg  TWICE PER DAY If you need a refill on your cardiac medications before your next appointment, please call your pharmacy.   Lab work: PLEASE SCHEDULE TO BE DONE 4 WEEKS FLP HFT If you have labs (blood work) drawn today and your tests are completely normal, you will receive your results only by: Marland Kitchen MyChart Message (if you have MyChart) OR . A paper copy in the mail If you have any lab test that is abnormal or we need to change your treatment, we will call you to review the results.  Testing/Procedures: NONE  Follow-Up: At [facility], you and your health needs are our priority.  As part of our continuing mission to provide you with exceptional heart care, we have created designated Provider Care Teams.  These Care Teams include your primary Cardiologist (physician) and Advanced Practice Providers (APPs -  Physician Assistants and Nurse Practitioners) who all work together to provide you with the care you need, when you need it. You will need a follow up appointment in 4 months.  Please call our office 2 months in advance to schedule this appointment.  You may see Lance Muss, MD or one of the following Advanced Practice Providers on your designated Care Team:   [place], PA-C Ronie Spies, PA-C . Jacolyn Reedy, PA-C  Any Other Special Instructions Will Be Listed Below (If Applicable). CHECK BP IF BELOW 130/80 AND HR LESS THAN 80 RESTING, PLEASE CALL OUR OFFICE.

## 2018-07-25 ENCOUNTER — Other Ambulatory Visit: Payer: Managed Care, Other (non HMO)

## 2018-09-05 ENCOUNTER — Telehealth: Payer: Self-pay | Admitting: Interventional Cardiology

## 2018-09-05 NOTE — Telephone Encounter (Signed)
Called and spoke to patient who states that for the past week his gums have been bleeding and he has been having nosebleeds that take him 15-30 minutes to stop the bleeding. Patient states that he has been bruising a lot easier and that his stool has gotten darker and is a dark brown. He is taking ASA 81 mg and Brilinta 90 mg BID s/p NSTEMI on 06/14/18. Denies NSAID use. Will forward to Dr. Eldridge Dace for review and recommendation.

## 2018-09-05 NOTE — Telephone Encounter (Signed)
Patient is calling because he thinks his blood thinner is too strong. He is bleeding from the gums and having nose bleeds. Would like to know if his medication can be adjusted.

## 2018-09-05 NOTE — Telephone Encounter (Signed)
OK to stop aspirin and continue Brilinta.  If bleeding continues, would change to clopidogrel 75 mg daily.

## 2018-09-05 NOTE — Telephone Encounter (Signed)
Called and instructed patient to stop ASA and continue Brilinta. Instructed patient to let us know if he continued to have issues with bleeding ad we will consider switching his Brilinta to plavix. Patient verbalized understanding and thanked me for the call.

## 2018-09-27 ENCOUNTER — Other Ambulatory Visit: Payer: Managed Care, Other (non HMO)

## 2018-11-06 ENCOUNTER — Other Ambulatory Visit: Payer: Managed Care, Other (non HMO) | Admitting: *Deleted

## 2018-11-06 ENCOUNTER — Other Ambulatory Visit: Payer: Self-pay

## 2018-11-06 DIAGNOSIS — E785 Hyperlipidemia, unspecified: Secondary | ICD-10-CM

## 2018-11-06 LAB — HEPATIC FUNCTION PANEL
ALT: 22 IU/L (ref 0–44)
AST: 27 IU/L (ref 0–40)
Albumin: 4.4 g/dL (ref 4.0–5.0)
Alkaline Phosphatase: 102 IU/L (ref 39–117)
Bilirubin Total: 0.4 mg/dL (ref 0.0–1.2)
Bilirubin, Direct: 0.13 mg/dL (ref 0.00–0.40)
Total Protein: 6.9 g/dL (ref 6.0–8.5)

## 2018-11-06 LAB — LIPID PANEL
Chol/HDL Ratio: 3.2 ratio (ref 0.0–5.0)
Cholesterol, Total: 103 mg/dL (ref 100–199)
HDL: 32 mg/dL — ABNORMAL LOW (ref >39)
LDL Calculated: 54 mg/dL (ref 0–99)
Triglycerides: 86 mg/dL (ref 0–149)
VLDL Cholesterol Cal: 17 mg/dL (ref 5–40)

## 2018-11-13 ENCOUNTER — Telehealth: Payer: Self-pay

## 2018-11-13 NOTE — Telephone Encounter (Signed)
Notes recorded by Frederik Schmidt, RN on 11/13/2018 at 4:32 PM EDT  Lpm with results/recommendations. 7/13  ------

## 2018-11-13 NOTE — Telephone Encounter (Signed)
-----   Message from Consuelo Pandy, [place] sent at 11/13/2018  4:17 PM EDT ----- Cholesterol is at goal of < 70 at 54 mg/dL. This is great! Liver enzymes are ok with medication. Continue Lipitor 40 mg nightly.

## 2019-05-26 ENCOUNTER — Other Ambulatory Visit: Payer: Self-pay | Admitting: Cardiology

## 2019-06-26 ENCOUNTER — Other Ambulatory Visit: Payer: Self-pay | Admitting: Cardiology

## 2019-07-30 ENCOUNTER — Other Ambulatory Visit: Payer: Self-pay | Admitting: Cardiology

## 2019-08-06 ENCOUNTER — Other Ambulatory Visit: Payer: Self-pay

## 2019-08-06 MED ORDER — ATORVASTATIN CALCIUM 80 MG PO TABS: 80.0000 mg | ORAL_TABLET | Freq: Every day | ORAL | 0 refills | Status: DC

## 2019-08-23 ENCOUNTER — Other Ambulatory Visit: Payer: Self-pay

## 2019-08-23 ENCOUNTER — Ambulatory Visit: Payer: Managed Care, Other (non HMO) | Admitting: Interventional Cardiology

## 2019-08-23 ENCOUNTER — Encounter: Payer: Self-pay | Admitting: Interventional Cardiology

## 2019-08-23 VITALS — BP 118/88 | HR 80 | Ht 70.0 in | Wt 247.0 lb

## 2019-08-23 DIAGNOSIS — I25118 Atherosclerotic heart disease of native coronary artery with other forms of angina pectoris: Secondary | ICD-10-CM

## 2019-08-23 DIAGNOSIS — I10 Essential (primary) hypertension: Secondary | ICD-10-CM | POA: Diagnosis not present

## 2019-08-23 DIAGNOSIS — E785 Hyperlipidemia, unspecified: Secondary | ICD-10-CM | POA: Diagnosis not present

## 2019-08-23 DIAGNOSIS — I252 Old myocardial infarction: Secondary | ICD-10-CM

## 2019-08-23 DIAGNOSIS — E1159 Type 2 diabetes mellitus with other circulatory complications: Secondary | ICD-10-CM

## 2019-08-23 MED ORDER — OLMESARTAN-AMLODIPINE-HCTZ 20-5-12.5 MG PO TABS: 1.0000 | ORAL_TABLET | Freq: Every day | ORAL | 3 refills | Status: DC

## 2019-08-23 MED ORDER — CLOPIDOGREL BISULFATE 75 MG PO TABS: 75.0000 mg | ORAL_TABLET | Freq: Every day | ORAL | 3 refills | Status: DC

## 2019-08-23 MED ORDER — PANTOPRAZOLE SODIUM 40 MG PO TBEC: 40.0000 mg | DELAYED_RELEASE_TABLET | Freq: Every day | ORAL | 3 refills | Status: DC

## 2019-08-23 MED ORDER — ATORVASTATIN CALCIUM 40 MG PO TABS: 40.0000 mg | ORAL_TABLET | Freq: Every day | ORAL | 3 refills | Status: DC

## 2019-08-23 MED ORDER — NITROGLYCERIN 0.4 MG SL SUBL: 0.4000 mg | SUBLINGUAL_TABLET | SUBLINGUAL | 3 refills | Status: DC | PRN

## 2019-08-23 MED ORDER — METOPROLOL TARTRATE 25 MG PO TABS: 37.5000 mg | ORAL_TABLET | Freq: Two times a day (BID) | ORAL | 3 refills | Status: DC

## 2019-08-23 NOTE — Patient Instructions (Signed)
Medication Instructions:  Your physician has recommended you make the following change in your medication:   1. STOP: brilinta  2. START: clopidogrel (plavix) 75 mg tablet: Take 1 tablet by mouth once a day  3. STOP: omeprazole (prilosec)  4. START: pantoprazole (protonix) 40 mg tablet: Take 1 tablet by mouth once a day  5. CONTINUE taking atorvastatin 40 mg once a day  *If you need a refill on your cardiac medications before your next appointment, please call your pharmacy*   Lab Work: None ordered  If you have labs (blood work) drawn today and your tests are completely normal, you will receive your results only by: Marland Kitchen MyChart Message (if you have MyChart) OR . A paper copy in the mail If you have any lab test that is abnormal or we need to change your treatment, we will call you to review the results.   Testing/Procedures: None ordered   Follow-Up: At [facility], you and your health needs are our priority.  As part of our continuing mission to provide you with exceptional heart care, we have created designated Provider Care Teams.  These Care Teams include your primary Cardiologist (physician) and Advanced Practice Providers (APPs -  Physician Assistants and Nurse Practitioners) who all work together to provide you with the care you need, when you need it.  We recommend signing up for the patient portal called "MyChart".  Sign up information is provided on this After Visit Summary.  MyChart is used to connect with patients for Virtual Visits (Telemedicine).  Patients are able to view lab/test results, encounter notes, upcoming appointments, etc.  Non-urgent messages can be sent to your provider as well.   To learn more about what you can do with MyChart, go to [email or web address].    Your next appointment:   12 month(s)  The format for your next appointment:   In Person  Provider:   You may see Lance Muss, MD or one of the following Advanced Practice  Providers on your designated Care Team:    Ronie Spies, PA-C  Jacolyn Reedy, PA-C    Other Instructions

## 2019-08-23 NOTE — Progress Notes (Signed)
Cardiology Office Note   Date:  08/23/2019   ID:  Robert Kerr, DOB [birth year], MRN [id number]  PCP:  Robert Corporal, PA    No chief complaint on file.    Wt Readings from Last 3 Encounters:  08/23/19 247 lb (112 kg)  06/27/18 278 lb 12.8 oz (126.5 kg)  06/14/18 286 lb (129.7 kg)       History of Present Illness: Robert Kerr is a 49 y.o. male  with PMH of NIDDM, HTN, GERD and diverticulitis. He is followed regularly by his PCP and has been diabetic for the past 5 years. Non-smoker. Strong family hx of CAD with father having his first MI at the age of 51, and 3 subsequent MIs to follow. Soundedlike his brother had balloon angioplasty in his 47s. Sister with valve disease and replacement. 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   He presented to [facility] on 06/14/18 with SSCP c/w unstable angina. In the ED his labs showed stable electrolytes, Trop 0.02>>0.10. EKG showed SR without ischemia. He underwent cardiac catheterization and was found to have an occluded RCA. Noted to have a total occlusion of this nondominant vessel. Successful angioplasty and stenting with 0 stenosis and TIMI grade II-III flow post procedure. Procedure was complicated by slow flow and embolization to an acute marginal branch of the RCA. Also noted to have residual 60% stenosis in the OM1 and 85% stenosis in the OM2 and mild 30% proximal to mid LAD, to be treated medically. He was placed on IV Aggrastat for 18 hours post cath given this embolic event. Echo was not done, but LV gram at time of cath showed normal LVEF at 55-60%. Plan for DAPT with aspirin and Brilinta for 1 year. He was continued on his home blood pressure medication regimen with the addition of metoprolol which was titrated up to 25 mg twice daily. High-dose statin was added. Troponin peaked at 6.64, LDL noted at 74 with triglycerides 176. No recurrent chest pain post procedure. Worked well with cardiac rehab  the following morning. No complications noted post cath. It was noted by internationalist to consider PCI of the obtuse marginal branch, if he has recurrent angina.   He was seen in 2020.  He was doing well at that time on medical therapy.  No further PCI of the OM 2 was done.  His metoprolol was increased due to mildly elevated blood pressure and heart rate.  Since the last visit, he has improved his diet.  He has lost 40 lbs.  He is off of his DM meds at this time.  He cut carbs.  He did have a gout flare when he had too much protein.  Uses myfitnesspal app.    He had some gum bleeding but this resolved with stopping aspirin.  Muscle pain improved with decrease of atorvastatin to 40 mg daily.   Brother died from a stroke and had Factor V Leiden.  The patient states that he tested negative for Factor V leiden.   Denies : Chest pain. Dizziness. Leg edema. Nitroglycerin use. Orthopnea. Palpitations. Paroxysmal nocturnal dyspnea. Shortness of breath. Syncope.    He is exercising regularly without cardiac sx.  He hikes and works out in [employer]    He got both of his COVID shots.     Past Medical History:  Diagnosis Date  . Diabetes mellitus without complication (HCC)   . Diverticulitis   . GERD (gastroesophageal reflux disease)   .  Hypertension     Past Surgical History:  Procedure Laterality Date  . CORONARY STENT INTERVENTION N/A 06/14/2018   Procedure: CORONARY STENT INTERVENTION;  Surgeon: Robert Crome, MD;  Location: [place] [facility];  Service: Cardiovascular;  Laterality: N/A;  . LEFT HEART CATH AND CORONARY ANGIOGRAPHY N/A 06/14/2018   Procedure: LEFT HEART CATH AND CORONARY ANGIOGRAPHY;  Surgeon: Robert Crome, MD;  Location: [place] [facility];  Service: Cardiovascular;  Laterality: N/A;     Current Outpatient Medications  Medication Sig Dispense Refill  . atorvastatin (LIPITOR) 80 MG tablet Take 1 tablet (80 mg total) by mouth daily at 6 PM. Please schedule annual appt  for refills. [phone]. 2nd attempt. (Patient taking differently: Take 40 mg by mouth daily at 6 PM. Please schedule annual appt for refills. [phone]. 2nd attempt.) 15 tablet 0  . loratadine (CLARITIN) 10 MG tablet Take 10 mg by mouth daily.    . metoprolol tartrate (LOPRESSOR) 25 MG tablet Take 1.5 tablets (37.5 mg total) by mouth 2 (two) times daily. Please make annual appt with Dr. Irish Lack for refills. [phone]. Thank you 180 tablet 0  . nitroGLYCERIN (NITROSTAT) 0.4 MG SL tablet Place 1 tablet (0.4 mg total) under the tongue every 5 (five) minutes as needed for chest pain. 25 tablet 1  . Olmesartan-amLODIPine-HCTZ 20-5-12.5 MG TABS Take 1 tablet by mouth daily.    Marland Kitchen omeprazole (PRILOSEC) 20 MG capsule Take 20 mg by mouth daily.    . Probiotic Product (PROBIOTIC-10 ULTIMATE) CAPS Take 1 tablet by mouth daily.    . Pseudoeph-Doxylamine-DM-APAP (NYQUIL MULTI-SYMPTOM PO) Take 2 capsules by mouth every 6 (six) hours as needed (cold symptoms).    . ticagrelor (BRILINTA) 90 MG TABS tablet Take 1 tablet (90 mg total) by mouth 2 (two) times daily. 60 tablet 11  . valACYclovir (VALTREX) 1000 MG tablet Take 1 g by mouth as needed. Cold sores    . Vitamin D, Ergocalciferol, (DRISDOL) 1.25 MG (50000 UT) CAPS capsule Take 50,000 Units by mouth 2 (two) times a week. Monday, Wednesday     No current facility-administered medications for this visit.    Allergies:   Demerol [meperidine hcl], Erythromycin, and Penicillins    Social History:  The patient  reports that he has never smoked. He has never used smokeless tobacco. He reports previous alcohol use. He reports that he does not use drugs.   Family History:  The patient's family history includes Heart attack in his father; Heart disease in his brother and father.    ROS:  Please see the history of present illness.   Otherwise, review of systems are positive for intentional weight loss.   All other systems are reviewed and negative.     PHYSICAL EXAM: VS:  BP 118/88   Pulse 80   Ht 5\' 10"  (1.778 m)   Wt 247 lb (112 kg)   SpO2 99%   BMI 35.44 kg/m  , BMI Body mass index is 35.44 kg/m. GEN: Well nourished, well developed, in no acute distress  HEENT: normal  Neck: no JVD, carotid bruits, or masses Cardiac: RRR; no murmurs, rubs, or gallops,no edema  Respiratory:  clear to auscultation bilaterally, normal work of breathing GI: soft, nontender, nondistended, + BS MS: no deformity or atrophy  Skin: warm and dry, no rash Neuro:  Strength and sensation are intact Psych: euthymic mood, full affect   EKG:   The ekg ordered today demonstrates NSR, RBBB   Recent Labs: 11/06/2018: ALT 22  Lipid Panel    Component Value Date/Time   CHOL 103 11/06/2018 1050   TRIG 86 11/06/2018 1050   HDL 32 (L) 11/06/2018 1050   CHOLHDL 3.2 11/06/2018 1050   CHOLHDL 5.2 06/15/2018 0705   VLDL 35 06/15/2018 0705   LDLCALC 54 11/06/2018 1050     Other studies Reviewed: Additional studies/ records that were reviewed today with results demonstrating: 3/21 labs reviewed.   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 Start clopidogrel 75 mg daily.  Change prilosec to protonix.  2. Hyperlipidemia: LDL 74 in 3/21.  Continue atorvastatin 40 mg daily.  3. HTN: The current medical regimen is effective;  continue present plan and medications. 4. Eats high fiber diet for his divertuculitis.  Follows with Dr. Chales Abrahams in [facility] for GI issues.  Had esophageal erosion as well, when he was 280 lbs, so PPI was started.    Current medicines are reviewed at length with the patient today.  The patient concerns regarding his medicines were addressed.  The following changes have been made:  No change  Labs/ tests ordered today include:  No orders of the defined types were placed in this encounter.   Recommend 150 minutes/week of aerobic exercise Low fat, low carb, high fiber diet recommended  Disposition:   FU in 1 year     Signed, Lance Muss, MD  08/23/2019 8:32 AM    [facility] [address] [place], [place], [place]  [address] Phone: [phone]; Fax: [phone]

## 2020-06-10 IMAGING — CR DG CHEST 2V
2 series · 2 of 2 positions shown · non-contrast
Comparison: None.

CLINICAL DATA: Chest pain

EXAM:
CHEST - 2 VIEW

[chest pa]
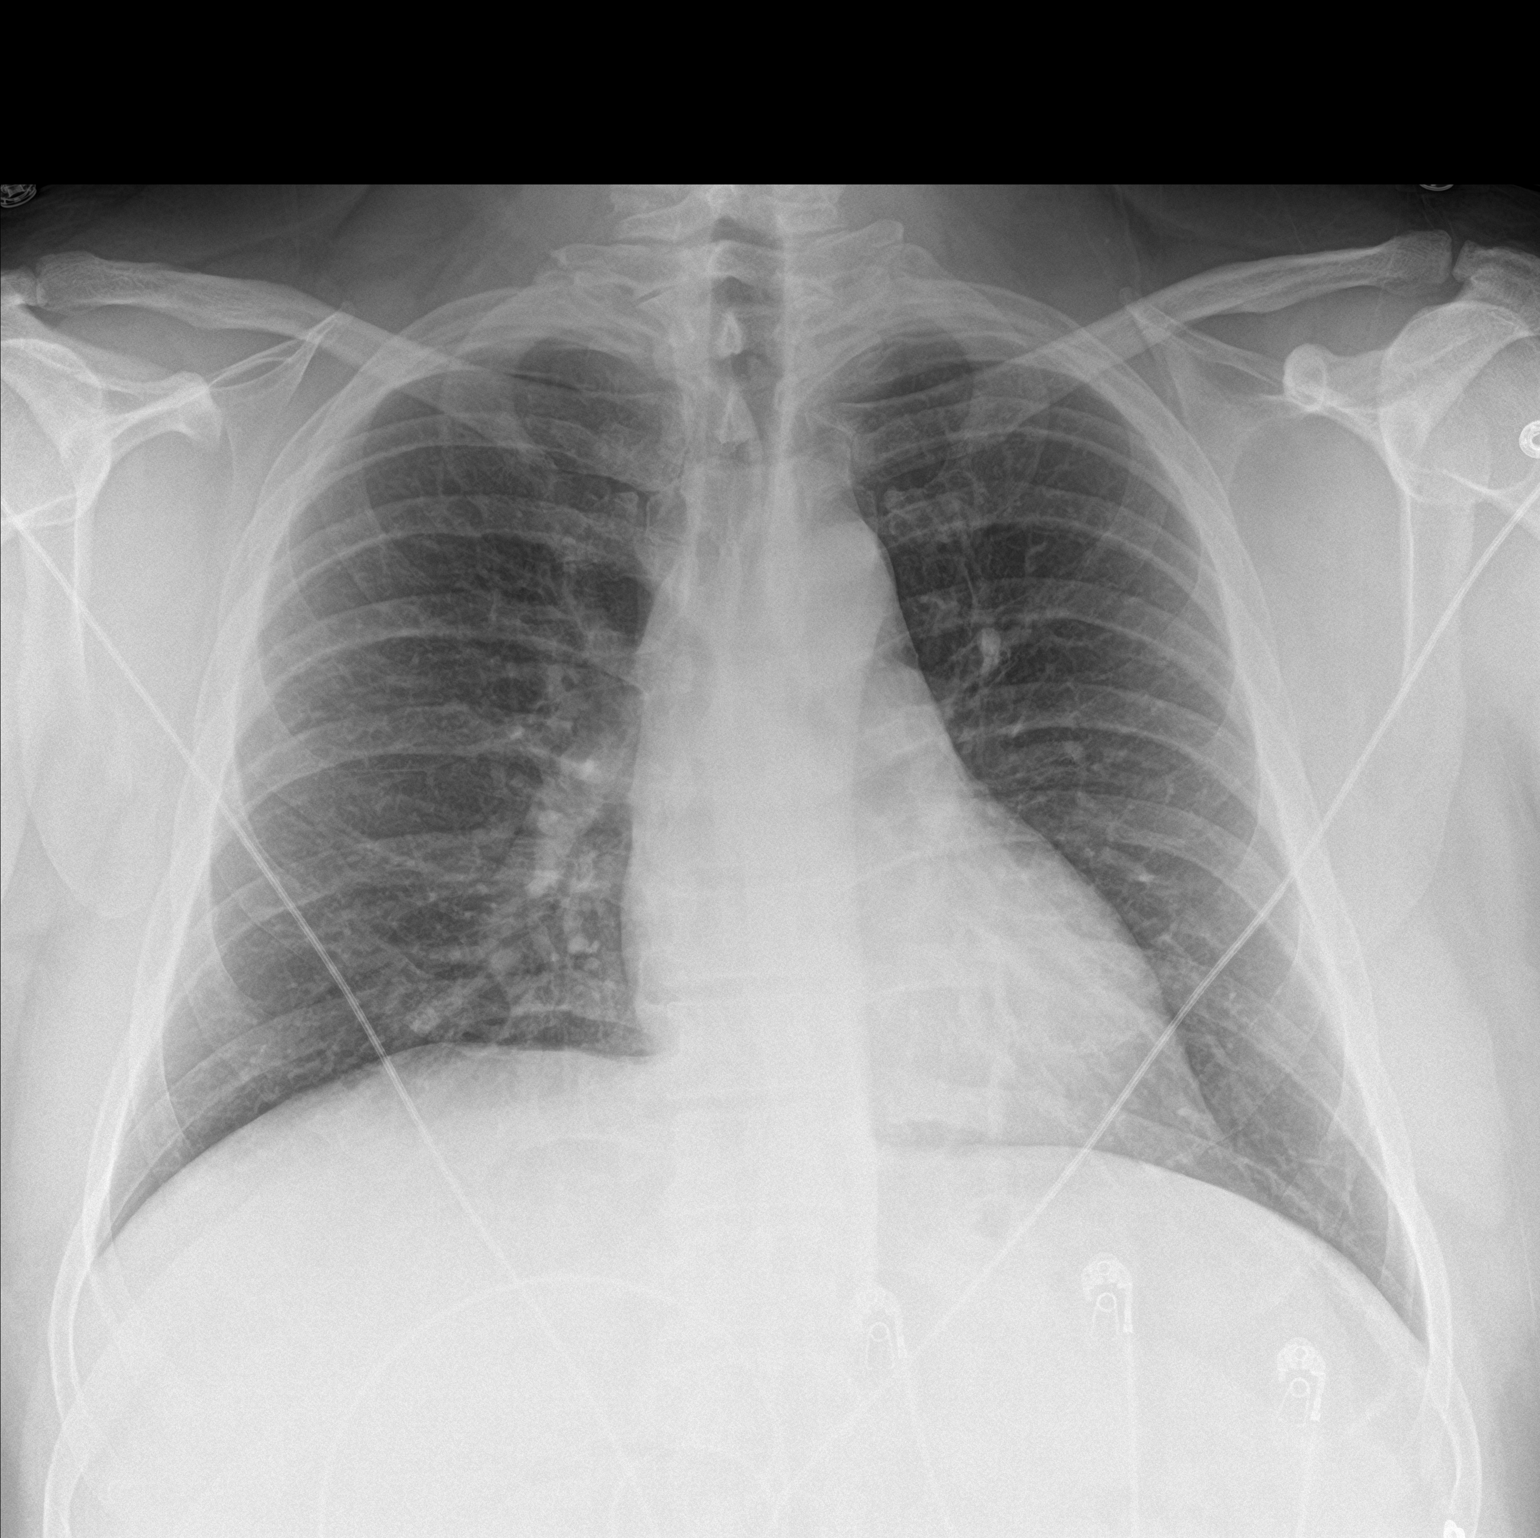

[chest lat]
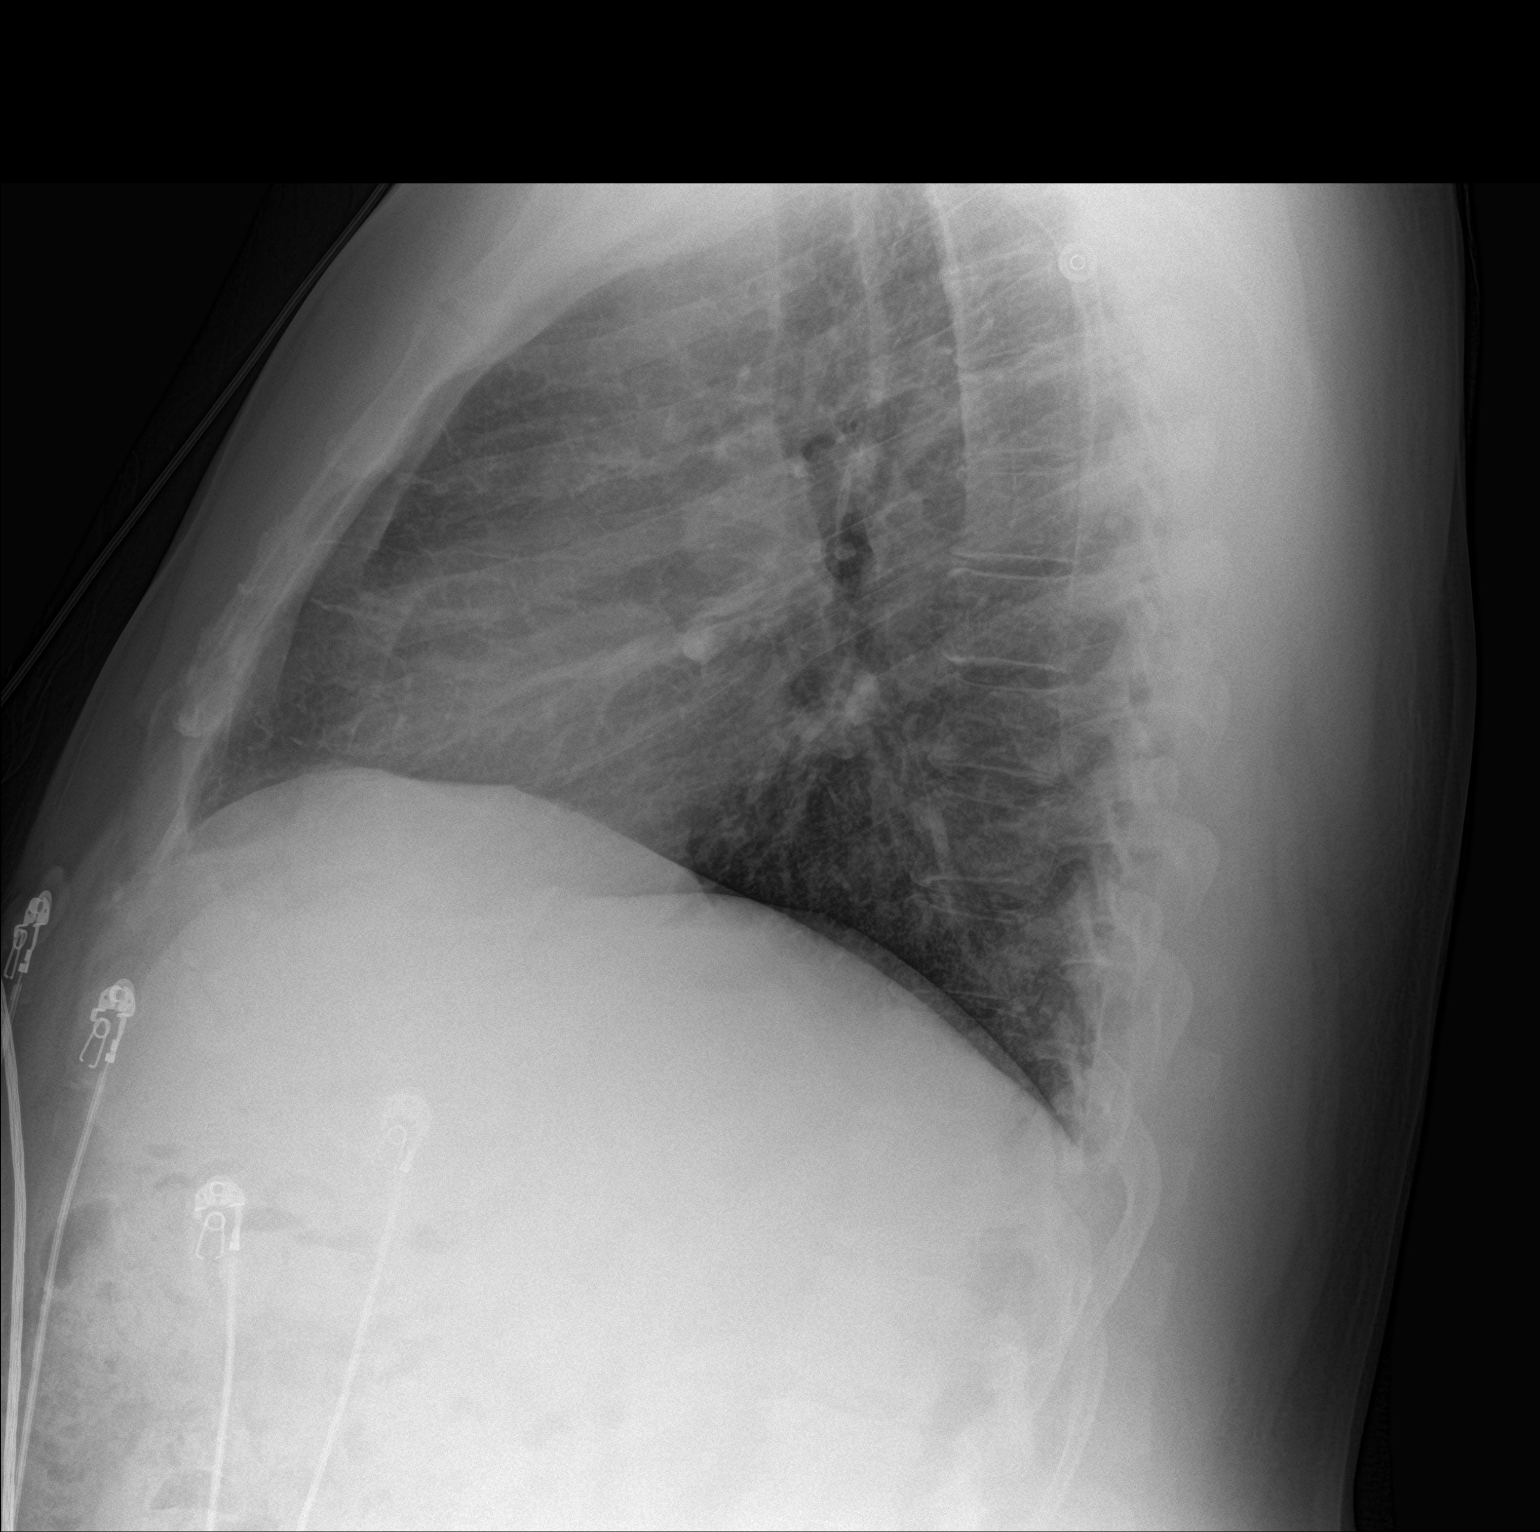

[2 of 2 positions shown; findings below may reference images not displayed]

FINDINGS: Lungs are clear. Heart size and pulmonary vascularity are normal. No
adenopathy. No bone lesions. No pneumothorax.
IMPRESSION: No edema or consolidation.

## 2020-09-10 ENCOUNTER — Other Ambulatory Visit: Payer: Self-pay | Admitting: Interventional Cardiology

## 2020-09-15 ENCOUNTER — Ambulatory Visit: Payer: Managed Care, Other (non HMO) | Admitting: Interventional Cardiology

## 2020-10-09 NOTE — Progress Notes (Signed)
Hi Dr. Sharyn Kerr Mr. Stiff wants and feels okay.  Plan lesion of the   Cardiology Office Note   Date:  10/10/2020   ID:  Robert Kerr, DOB 02/21/1971, MRN [id number]  PCP:  [name], PA    No chief complaint on file.  CAD  Wt Readings from Last 3 Encounters:  10/10/20 279 lb (126.6 kg)  08/23/19 247 lb (112 kg)  06/27/18 278 lb 12.8 oz (126.5 kg)       History of Present Illness: Robert Kerr is a 50 y.o. male  with PMH of NIDDM, HTN, GERD and diverticulitis. He is followed regularly by his PCP and has been diabetic for the past 5 years. Non-smoker. Strong family hx of CAD with father having his first MI at the age of 80, and 3 subsequent MIs to follow. Sounded like his brother had balloon angioplasty in his 20s. Sister with valve disease and replacement. 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    He presented to [facility] on 06/14/18 with SSCP c/w unstable angina.  In the ED his labs showed stable electrolytes, Trop 0.02>>0.10. EKG showed SR without ischemia. He underwent cardiac catheterization and was found to have an occluded RCA. Noted to have a total occlusion of this nondominant vessel.  Successful angioplasty and stenting with 0 stenosis and TIMI grade II-III flow post procedure.  Procedure was complicated by slow flow and embolization to an acute marginal branch of the RCA.  Also noted to have residual 60% stenosis in the OM1 and 85% stenosis in the OM2 and mild 30% proximal to mid LAD, to be treated medically. He was placed on IV Aggrastat for 18 hours post cath given this embolic event.  Echo was not done, but LV gram at time of cath showed normal LVEF at 55-60%. Plan for DAPT with aspirin and Brilinta for 1 year.  He was continued on his home blood pressure medication regimen with the addition of metoprolol which was titrated up to 25 mg twice daily.  High-dose statin was added.  Troponin peaked at 6.64, LDL noted at 74 with triglycerides 176.  No  recurrent chest pain post procedure.  Worked well with cardiac rehab the following morning.  No complications noted post cath. It was noted by internationalist to consider PCI of the obtuse marginal branch, if he has recurrent angina.    He was seen in 2020.  He was doing well at that time on medical therapy.  No further PCI of the OM 2 was done.  His metoprolol was increased due to mildly elevated blood pressure and heart rate.   He was hiking in 2021-2022 until a recent tendinitis injury prevented him from exercising. He was using a walking boot.   Denies : Chest pain. Dizziness. Leg edema. Nitroglycerin use. Orthopnea. Palpitations. Paroxysmal nocturnal dyspnea. Shortness of breath. Syncope.      Past Medical History:  Diagnosis Date   Diabetes mellitus without complication (HCC)    Diverticulitis    GERD (gastroesophageal reflux disease)    Hypertension     Past Surgical History:  Procedure Laterality Date   CORONARY STENT INTERVENTION N/A 06/14/2018   Procedure: CORONARY STENT INTERVENTION;  Surgeon: Lyn Records, MD;  Location: [facility];  Service: Cardiovascular;  Laterality: N/A;   LEFT HEART CATH AND CORONARY ANGIOGRAPHY N/A 06/14/2018   Procedure: LEFT HEART CATH AND CORONARY ANGIOGRAPHY;  Surgeon: Lyn Records, MD;  Location: [facility];  Service: Cardiovascular;  Laterality: N/A;     Current Outpatient Medications  Medication Sig Dispense Refill   atorvastatin (LIPITOR) 40 MG tablet Take 1 tablet (40 mg total) by mouth daily at 6 PM. 90 tablet 3   clopidogrel (PLAVIX) 75 MG tablet Take 1 tablet (75 mg total) by mouth daily. 30 tablet 0   loratadine (CLARITIN) 10 MG tablet Take 10 mg by mouth daily.     metoprolol tartrate (LOPRESSOR) 25 MG tablet Take 1.5 tablets (37.5 mg total) by mouth 2 (two) times daily. Pt needs to keep upcoming appt in June for further refills 90 tablet 0   nitroGLYCERIN (NITROSTAT) 0.4 MG SL tablet Place 1 tablet (0.4 mg total)  under the tongue every 5 (five) minutes as needed for chest pain. 25 tablet 3   Olmesartan-amLODIPine-HCTZ 20-5-12.5 MG TABS Take 1 tablet by mouth daily. Pt needs to keep upcoming appt in June for further refills 30 tablet 0   pantoprazole (PROTONIX) 40 MG tablet Take 1 tablet (40 mg total) by mouth daily. 90 tablet 3   Probiotic Product (PROBIOTIC-10 ULTIMATE) CAPS Take 1 tablet by mouth daily.     Pseudoeph-Doxylamine-DM-APAP (NYQUIL MULTI-SYMPTOM PO) Take 2 capsules by mouth every 6 (six) hours as needed (cold symptoms).     valACYclovir (VALTREX) 1000 MG tablet Take 1 g by mouth as needed. Cold sores     Vitamin D, Ergocalciferol, (DRISDOL) 1.25 MG (50000 UT) CAPS capsule Take 50,000 Units by mouth 2 (two) times a week. Monday, Wednesday     No current facility-administered medications for this visit.    Allergies:   Demerol [meperidine hcl], Erythromycin, and Penicillins    Social History:  The patient  reports that he has never smoked. He has never used smokeless tobacco. He reports previous alcohol use. He reports that he does not use drugs.   Family History:  The patient's family history includes Heart attack in his father; Heart disease in his brother and father.    ROS:  Please see the history of present illness.   Otherwise, review of systems are positive for foot problems recently.   All other systems are reviewed and negative.    PHYSICAL EXAM: VS:  BP (!) 136/98   Pulse 83   Ht 5\' 10"  (1.778 m)   Wt 279 lb (126.6 kg)   SpO2 97%   BMI 40.03 kg/m  , BMI Body mass index is 40.03 kg/m. GEN: Well nourished, well developed, in no acute distress HEENT: normal Neck: no JVD, carotid bruits, or masses Cardiac: RRR; no murmurs, rubs, or gallops,no edema  Respiratory:  clear to auscultation bilaterally, normal work of breathing GI: soft, nontender, nondistended, + BS MS: no deformity or atrophy Skin: warm and dry, no rash Neuro:  Strength and sensation are intact Psych:  euthymic mood, full affect   EKG:   The ekg ordered today demonstrates NSR, RBBB   Recent Labs: No results found for requested labs within last 8760 hours.   Lipid Panel    Component Value Date/Time   CHOL 103 11/06/2018 1050   TRIG 86 11/06/2018 1050   HDL 32 (L) 11/06/2018 1050   CHOLHDL 3.2 11/06/2018 1050   CHOLHDL 5.2 06/15/2018 0705   VLDL 35 06/15/2018 0705   LDLCALC 54 11/06/2018 1050     Other studies Reviewed: Additional studies/ records that were reviewed today with results demonstrating: labs reviewed   ASSESSMENT AND PLAN:  CAD/Old MI: No angina. Continue aggressive secondary prevention.  Hyperlipidemia: The  current medical regimen is effective;  continue present plan and medications. Whole food, plant based diet. LDL 81 in 2022 HTN: The current medical regimen is effective;  continue present plan and medications.  Home readings are not being checked.  Diastolis should improve now that he can get back to exercise.   Type 2 DM: A1C 6.7- diet controlled.  High fiber, whole food, plant based diet.  Sees GI for diverticulitis and esophageal erosion. PPI started in the past. Changed to protonix due to plavix. No bleeding issues with the Plavix.    Current medicines are reviewed at length with the patient today.  The patient concerns regarding his medicines were addressed.  The following changes have been made:  No change  Labs/ tests ordered today include:  No orders of the defined types were placed in this encounter.   Recommend 150 minutes/week of aerobic exercise Low fat, low carb, high fiber diet recommended  Disposition:   FU in 1 year   Signed, Lance Muss, MD  10/10/2020 3:34 PM    [facility] [address] [place], [place], [place]  [address] Phone: [phone]; Fax: [phone]

## 2020-10-10 ENCOUNTER — Ambulatory Visit: Payer: Managed Care, Other (non HMO) | Admitting: Interventional Cardiology

## 2020-10-10 ENCOUNTER — Encounter: Payer: Self-pay | Admitting: Interventional Cardiology

## 2020-10-10 ENCOUNTER — Other Ambulatory Visit: Payer: Self-pay

## 2020-10-10 VITALS — BP 136/98 | HR 83 | Ht 70.0 in | Wt 279.0 lb

## 2020-10-10 DIAGNOSIS — E785 Hyperlipidemia, unspecified: Secondary | ICD-10-CM | POA: Diagnosis not present

## 2020-10-10 DIAGNOSIS — I25118 Atherosclerotic heart disease of native coronary artery with other forms of angina pectoris: Secondary | ICD-10-CM | POA: Diagnosis not present

## 2020-10-10 DIAGNOSIS — E1159 Type 2 diabetes mellitus with other circulatory complications: Secondary | ICD-10-CM

## 2020-10-10 DIAGNOSIS — I252 Old myocardial infarction: Secondary | ICD-10-CM

## 2020-10-10 DIAGNOSIS — I10 Essential (primary) hypertension: Secondary | ICD-10-CM | POA: Diagnosis not present

## 2020-10-10 MED ORDER — NITROGLYCERIN 0.4 MG SL SUBL: 0.4000 mg | SUBLINGUAL_TABLET | SUBLINGUAL | 6 refills | Status: AC | PRN

## 2020-10-10 MED ORDER — PANTOPRAZOLE SODIUM 40 MG PO TBEC: 40.0000 mg | DELAYED_RELEASE_TABLET | Freq: Every day | ORAL | 3 refills | Status: DC

## 2020-10-10 MED ORDER — OLMESARTAN-AMLODIPINE-HCTZ 20-5-12.5 MG PO TABS: 1.0000 | ORAL_TABLET | Freq: Every day | ORAL | 3 refills | Status: DC

## 2020-10-10 MED ORDER — METOPROLOL TARTRATE 25 MG PO TABS: 37.5000 mg | ORAL_TABLET | Freq: Two times a day (BID) | ORAL | 3 refills | Status: AC

## 2020-10-10 MED ORDER — CLOPIDOGREL BISULFATE 75 MG PO TABS: 75.0000 mg | ORAL_TABLET | Freq: Every day | ORAL | 3 refills | Status: DC

## 2020-10-10 MED ORDER — ATORVASTATIN CALCIUM 40 MG PO TABS: 40.0000 mg | ORAL_TABLET | Freq: Every day | ORAL | 3 refills | Status: DC

## 2020-10-10 NOTE — Patient Instructions (Addendum)
Medication Instructions:  Your physician recommends that you continue on your current medications as directed. Please refer to the Current Medication list given to you today.  *If you need a refill on your cardiac medications before your next appointment, please call your pharmacy*   Lab Work: none If you have labs (blood work) drawn today and your tests are completely normal, you will receive your results only by: MyChart Message (if you have MyChart) OR A paper copy in the mail If you have any lab test that is abnormal or we need to change your treatment, we will call you to review the results.   Testing/Procedures: none   Follow-Up: At [facility], you and your health needs are our priority.  As part of our continuing mission to provide you with exceptional heart care, we have created designated Provider Care Teams.  These Care Teams include your primary Cardiologist (physician) and Advanced Practice Providers (APPs -  Physician Assistants and Nurse Practitioners) who all work together to provide you with the care you need, when you need it.  We recommend signing up for the patient portal called "MyChart".  Sign up information is provided on this After Visit Summary.  MyChart is used to connect with patients for Virtual Visits (Telemedicine).  Patients are able to view lab/test results, encounter notes, upcoming appointments, etc.  Non-urgent messages can be sent to your provider as well.   To learn more about what you can do with MyChart, go to [email or web address].    Your next appointment:   12 month(s)  The format for your next appointment:   In Person  Provider:   You may see Jayadeep Varanasi, MD or one of the following Advanced Practice Providers on your designated Care Team:   Dayna Dunn, PA-C Michele Lenze, PA-C   Other Instructions High-Fiber Eating Plan Fiber, also called dietary fiber, is a type of carbohydrate. It is found foods such as fruits, vegetables,  whole grains, and beans. A high-fiber diet can have many health benefits. Your health care provider may recommend a high-fiber diet to help: Prevent constipation. Fiber can make your bowel movements more regular. Lower your cholesterol. Relieve the following conditions: Inflammation of veins in the anus (hemorrhoids). Inflammation of specific areas of the digestive tract (uncomplicated diverticulosis). A problem of the large intestine, also called the colon, that sometimes causes pain and diarrhea (irritable bowel syndrome, or IBS). Prevent overeating as part of a weight-loss plan. Prevent heart disease, type 2 diabetes, and certain cancers. What are tips for following this plan? Reading food labels  Check the nutrition facts label on food products for the amount of dietary fiber. Choose foods that have 5 grams of fiber or more per serving. The goals for recommended daily fiber intake include: Men (age 50 or younger): 34-38 g. Men (over age 50): 28-34 g. Women (age 50 or younger): 25-28 g. Women (over age 50): 22-25 g. Your daily fiber goal is _____________ g. Shopping Choose whole fruits and vegetables instead of processed forms, such as apple juice or applesauce. Choose a wide variety of high-fiber foods such as avocados, lentils, oats, and kidney beans. Read the nutrition facts label of the foods you choose. Be aware of foods with added fiber. These foods often have high sugar and sodium amounts per serving. Cooking Use whole-grain flour for baking and cooking. Cook with brown rice instead of white rice. Meal planning Start the day with a breakfast that is high in fiber, such as a cereal that   contains 5 g of fiber or more per serving. Eat breads and cereals that are made with whole-grain flour instead of refined flour or white flour. Eat brown rice, bulgur wheat, or millet instead of white rice. Use beans in place of meat in soups, salads, and pasta dishes. Be sure that half of the  grains you eat each day are whole grains. General information You can get the recommended daily intake of dietary fiber by: Eating a variety of fruits, vegetables, grains, nuts, and beans. Taking a fiber supplement if you are not able to take in enough fiber in your diet. It is better to get fiber through food than from a supplement. Gradually increase how much fiber you consume. If you increase your intake of dietary fiber too quickly, you may have bloating, cramping, or gas. Drink plenty of water to help you digest fiber. Choose high-fiber snacks, such as berries, raw vegetables, nuts, and popcorn. What foods should I eat? Fruits Berries. Pears. Apples. Oranges. Avocado. Prunes and raisins. Dried figs. Vegetables Sweet potatoes. Spinach. Kale. Artichokes. Cabbage. Broccoli. Cauliflower.Green peas. Carrots. Squash. Grains Whole-grain breads. Multigrain cereal. Oats and oatmeal. Brown rice. Barley.Bulgur wheat. Millet. Quinoa. Bran muffins. Popcorn. Rye wafer crackers. Meats and other proteins Navy beans, kidney beans, and pinto beans. Soybeans. Split peas. Lentils. Nutsand seeds. Dairy Fiber-fortified yogurt. Beverages Fiber-fortified soy milk. Fiber-fortified orange juice. Other foods Fiber bars. The items listed above may not be a complete list of recommended foods and beverages. Contact a dietitian for more information. What foods should I avoid? Fruits Fruit juice. Cooked, strained fruit. Vegetables Fried potatoes. Canned vegetables. Well-cooked vegetables. Grains White bread. Pasta made with refined flour. White rice. Meats and other proteins Fatty cuts of meat. Fried chicken or fried fish. Dairy Milk. Yogurt. Cream cheese. Sour cream. Fats and oils Butters. Beverages Soft drinks. Other foods Cakes and pastries. The items listed above may not be a complete list of foods and beverages to avoid. Talk with your dietitian about what choices are best for you. Summary Fiber  is a type of carbohydrate. It is found in foods such as fruits, vegetables, whole grains, and beans. A high-fiber diet has many benefits. It can help to prevent constipation, lower blood cholesterol, aid weight loss, and reduce your risk of heart disease, diabetes, and certain cancers. Increase your intake of fiber gradually. Increasing fiber too quickly may cause cramping, bloating, and gas. Drink plenty of water while you increase the amount of fiber you consume. The best sources of fiber include whole fruits and vegetables, whole grains, nuts, seeds, and beans. This information is not intended to replace advice given to you by your health care provider. Make sure you discuss any questions you have with your healthcare provider. Document Revised: 08/23/2019 Document Reviewed: 08/23/2019 Elsevier Patient Education  2022 [employer]   

## 2021-10-23 ENCOUNTER — Other Ambulatory Visit: Payer: Self-pay | Admitting: Interventional Cardiology

## 2021-10-24 ENCOUNTER — Other Ambulatory Visit: Payer: Self-pay | Admitting: Interventional Cardiology

## 2021-11-05 DIAGNOSIS — Z6835 Body mass index (BMI) 35.0-35.9, adult: Secondary | ICD-10-CM | POA: Diagnosis not present

## 2021-11-05 DIAGNOSIS — E559 Vitamin D deficiency, unspecified: Secondary | ICD-10-CM | POA: Diagnosis not present

## 2021-11-19 DIAGNOSIS — K760 Fatty (change of) liver, not elsewhere classified: Secondary | ICD-10-CM | POA: Diagnosis not present

## 2021-11-19 DIAGNOSIS — Z6835 Body mass index (BMI) 35.0-35.9, adult: Secondary | ICD-10-CM | POA: Diagnosis not present

## 2021-12-03 DIAGNOSIS — Z6835 Body mass index (BMI) 35.0-35.9, adult: Secondary | ICD-10-CM | POA: Diagnosis not present

## 2021-12-03 DIAGNOSIS — E785 Hyperlipidemia, unspecified: Secondary | ICD-10-CM | POA: Diagnosis not present

## 2021-12-17 DIAGNOSIS — E785 Hyperlipidemia, unspecified: Secondary | ICD-10-CM | POA: Diagnosis not present

## 2021-12-17 DIAGNOSIS — Z6835 Body mass index (BMI) 35.0-35.9, adult: Secondary | ICD-10-CM | POA: Diagnosis not present

## 2021-12-29 DIAGNOSIS — E559 Vitamin D deficiency, unspecified: Secondary | ICD-10-CM | POA: Diagnosis not present

## 2021-12-29 DIAGNOSIS — I10 Essential (primary) hypertension: Secondary | ICD-10-CM | POA: Diagnosis not present

## 2021-12-29 DIAGNOSIS — N182 Chronic kidney disease, stage 2 (mild): Secondary | ICD-10-CM | POA: Diagnosis not present

## 2021-12-29 DIAGNOSIS — E1122 Type 2 diabetes mellitus with diabetic chronic kidney disease: Secondary | ICD-10-CM | POA: Diagnosis not present

## 2021-12-29 DIAGNOSIS — E785 Hyperlipidemia, unspecified: Secondary | ICD-10-CM | POA: Diagnosis not present

## 2021-12-31 DIAGNOSIS — Z6835 Body mass index (BMI) 35.0-35.9, adult: Secondary | ICD-10-CM | POA: Diagnosis not present

## 2021-12-31 DIAGNOSIS — K5792 Diverticulitis of intestine, part unspecified, without perforation or abscess without bleeding: Secondary | ICD-10-CM | POA: Diagnosis not present

## 2022-01-14 DIAGNOSIS — K760 Fatty (change of) liver, not elsewhere classified: Secondary | ICD-10-CM | POA: Diagnosis not present

## 2022-01-14 DIAGNOSIS — Z6834 Body mass index (BMI) 34.0-34.9, adult: Secondary | ICD-10-CM | POA: Diagnosis not present

## 2022-01-28 DIAGNOSIS — E119 Type 2 diabetes mellitus without complications: Secondary | ICD-10-CM | POA: Diagnosis not present

## 2022-02-11 DIAGNOSIS — E785 Hyperlipidemia, unspecified: Secondary | ICD-10-CM | POA: Diagnosis not present

## 2022-03-04 DIAGNOSIS — E785 Hyperlipidemia, unspecified: Secondary | ICD-10-CM | POA: Diagnosis not present

## 2022-03-18 DIAGNOSIS — I10 Essential (primary) hypertension: Secondary | ICD-10-CM | POA: Diagnosis not present

## 2022-04-02 DIAGNOSIS — E559 Vitamin D deficiency, unspecified: Secondary | ICD-10-CM | POA: Diagnosis not present

## 2022-04-02 DIAGNOSIS — I10 Essential (primary) hypertension: Secondary | ICD-10-CM | POA: Diagnosis not present

## 2022-05-12 DIAGNOSIS — E559 Vitamin D deficiency, unspecified: Secondary | ICD-10-CM | POA: Diagnosis not present

## 2022-05-12 DIAGNOSIS — E785 Hyperlipidemia, unspecified: Secondary | ICD-10-CM | POA: Diagnosis not present

## 2022-05-12 DIAGNOSIS — I10 Essential (primary) hypertension: Secondary | ICD-10-CM | POA: Diagnosis not present

## 2022-05-12 DIAGNOSIS — N182 Chronic kidney disease, stage 2 (mild): Secondary | ICD-10-CM | POA: Diagnosis not present

## 2022-05-12 DIAGNOSIS — E1122 Type 2 diabetes mellitus with diabetic chronic kidney disease: Secondary | ICD-10-CM | POA: Diagnosis not present

## 2022-05-20 DIAGNOSIS — I10 Essential (primary) hypertension: Secondary | ICD-10-CM | POA: Diagnosis not present

## 2022-06-03 DIAGNOSIS — K219 Gastro-esophageal reflux disease without esophagitis: Secondary | ICD-10-CM | POA: Diagnosis not present

## 2022-06-03 DIAGNOSIS — Z6837 Body mass index (BMI) 37.0-37.9, adult: Secondary | ICD-10-CM | POA: Diagnosis not present

## 2022-06-21 DIAGNOSIS — H1033 Unspecified acute conjunctivitis, bilateral: Secondary | ICD-10-CM | POA: Diagnosis not present

## 2022-06-24 DIAGNOSIS — L03213 Periorbital cellulitis: Secondary | ICD-10-CM | POA: Diagnosis not present

## 2022-06-25 DIAGNOSIS — L0390 Cellulitis, unspecified: Secondary | ICD-10-CM | POA: Diagnosis not present

## 2022-07-08 DIAGNOSIS — E785 Hyperlipidemia, unspecified: Secondary | ICD-10-CM | POA: Diagnosis not present

## 2022-07-08 DIAGNOSIS — Z6836 Body mass index (BMI) 36.0-36.9, adult: Secondary | ICD-10-CM | POA: Diagnosis not present

## 2022-07-22 DIAGNOSIS — Z6835 Body mass index (BMI) 35.0-35.9, adult: Secondary | ICD-10-CM | POA: Diagnosis not present

## 2022-07-22 DIAGNOSIS — E559 Vitamin D deficiency, unspecified: Secondary | ICD-10-CM | POA: Diagnosis not present

## 2022-07-22 DIAGNOSIS — E119 Type 2 diabetes mellitus without complications: Secondary | ICD-10-CM | POA: Diagnosis not present

## 2022-08-05 DIAGNOSIS — E559 Vitamin D deficiency, unspecified: Secondary | ICD-10-CM | POA: Diagnosis not present

## 2022-08-05 DIAGNOSIS — Z6836 Body mass index (BMI) 36.0-36.9, adult: Secondary | ICD-10-CM | POA: Diagnosis not present

## 2022-08-19 DIAGNOSIS — Z6835 Body mass index (BMI) 35.0-35.9, adult: Secondary | ICD-10-CM | POA: Diagnosis not present

## 2022-08-19 DIAGNOSIS — E119 Type 2 diabetes mellitus without complications: Secondary | ICD-10-CM | POA: Diagnosis not present

## 2022-09-01 DIAGNOSIS — I10 Essential (primary) hypertension: Secondary | ICD-10-CM | POA: Diagnosis not present

## 2022-09-01 DIAGNOSIS — Z6835 Body mass index (BMI) 35.0-35.9, adult: Secondary | ICD-10-CM | POA: Diagnosis not present

## 2022-09-01 DIAGNOSIS — E785 Hyperlipidemia, unspecified: Secondary | ICD-10-CM | POA: Diagnosis not present

## 2022-09-07 DIAGNOSIS — N182 Chronic kidney disease, stage 2 (mild): Secondary | ICD-10-CM | POA: Diagnosis not present

## 2022-09-07 DIAGNOSIS — E559 Vitamin D deficiency, unspecified: Secondary | ICD-10-CM | POA: Diagnosis not present

## 2022-09-07 DIAGNOSIS — I10 Essential (primary) hypertension: Secondary | ICD-10-CM | POA: Diagnosis not present

## 2022-09-07 DIAGNOSIS — N400 Enlarged prostate without lower urinary tract symptoms: Secondary | ICD-10-CM | POA: Diagnosis not present

## 2022-09-07 DIAGNOSIS — E785 Hyperlipidemia, unspecified: Secondary | ICD-10-CM | POA: Diagnosis not present

## 2022-09-07 DIAGNOSIS — E1122 Type 2 diabetes mellitus with diabetic chronic kidney disease: Secondary | ICD-10-CM | POA: Diagnosis not present

## 2022-09-23 DIAGNOSIS — E119 Type 2 diabetes mellitus without complications: Secondary | ICD-10-CM | POA: Diagnosis not present

## 2022-09-23 DIAGNOSIS — Z6837 Body mass index (BMI) 37.0-37.9, adult: Secondary | ICD-10-CM | POA: Diagnosis not present

## 2022-10-28 ENCOUNTER — Encounter: Payer: Self-pay | Admitting: Gastroenterology

## 2023-01-12 DIAGNOSIS — E782 Mixed hyperlipidemia: Secondary | ICD-10-CM | POA: Diagnosis not present

## 2023-01-12 DIAGNOSIS — I25118 Atherosclerotic heart disease of native coronary artery with other forms of angina pectoris: Secondary | ICD-10-CM | POA: Diagnosis not present

## 2023-01-12 DIAGNOSIS — I10 Essential (primary) hypertension: Secondary | ICD-10-CM | POA: Diagnosis not present

## 2023-01-12 DIAGNOSIS — E1159 Type 2 diabetes mellitus with other circulatory complications: Secondary | ICD-10-CM | POA: Diagnosis not present

## 2023-01-12 DIAGNOSIS — E559 Vitamin D deficiency, unspecified: Secondary | ICD-10-CM | POA: Diagnosis not present

## 2023-01-12 DIAGNOSIS — G5792 Unspecified mononeuropathy of left lower limb: Secondary | ICD-10-CM | POA: Diagnosis not present

## 2023-05-18 DIAGNOSIS — I10 Essential (primary) hypertension: Secondary | ICD-10-CM | POA: Diagnosis not present

## 2023-05-18 DIAGNOSIS — E1159 Type 2 diabetes mellitus with other circulatory complications: Secondary | ICD-10-CM | POA: Diagnosis not present

## 2023-05-18 DIAGNOSIS — E782 Mixed hyperlipidemia: Secondary | ICD-10-CM | POA: Diagnosis not present

## 2023-05-18 DIAGNOSIS — G5792 Unspecified mononeuropathy of left lower limb: Secondary | ICD-10-CM | POA: Diagnosis not present

## 2023-05-18 DIAGNOSIS — E559 Vitamin D deficiency, unspecified: Secondary | ICD-10-CM | POA: Diagnosis not present

## 2023-08-03 DIAGNOSIS — E1159 Type 2 diabetes mellitus with other circulatory complications: Secondary | ICD-10-CM | POA: Diagnosis not present

## 2023-08-03 DIAGNOSIS — Z6838 Body mass index (BMI) 38.0-38.9, adult: Secondary | ICD-10-CM | POA: Diagnosis not present

## 2023-08-17 DIAGNOSIS — E1159 Type 2 diabetes mellitus with other circulatory complications: Secondary | ICD-10-CM | POA: Diagnosis not present

## 2023-08-17 DIAGNOSIS — I25118 Atherosclerotic heart disease of native coronary artery with other forms of angina pectoris: Secondary | ICD-10-CM | POA: Diagnosis not present

## 2023-08-17 DIAGNOSIS — G5792 Unspecified mononeuropathy of left lower limb: Secondary | ICD-10-CM | POA: Diagnosis not present

## 2023-08-17 DIAGNOSIS — I10 Essential (primary) hypertension: Secondary | ICD-10-CM | POA: Diagnosis not present

## 2023-08-17 DIAGNOSIS — E782 Mixed hyperlipidemia: Secondary | ICD-10-CM | POA: Diagnosis not present

## 2023-11-11 DIAGNOSIS — R509 Fever, unspecified: Secondary | ICD-10-CM | POA: Diagnosis not present

## 2023-11-11 DIAGNOSIS — R630 Anorexia: Secondary | ICD-10-CM | POA: Diagnosis not present

## 2023-11-11 DIAGNOSIS — M791 Myalgia, unspecified site: Secondary | ICD-10-CM | POA: Diagnosis not present

## 2023-11-11 DIAGNOSIS — A932 Colorado tick fever: Secondary | ICD-10-CM | POA: Diagnosis not present

## 2023-11-11 DIAGNOSIS — M2550 Pain in unspecified joint: Secondary | ICD-10-CM | POA: Diagnosis not present

## 2023-11-11 DIAGNOSIS — R1084 Generalized abdominal pain: Secondary | ICD-10-CM | POA: Diagnosis not present

## 2023-11-17 DIAGNOSIS — I10 Essential (primary) hypertension: Secondary | ICD-10-CM | POA: Diagnosis not present

## 2023-11-17 DIAGNOSIS — E1159 Type 2 diabetes mellitus with other circulatory complications: Secondary | ICD-10-CM | POA: Diagnosis not present

## 2023-11-17 DIAGNOSIS — G5792 Unspecified mononeuropathy of left lower limb: Secondary | ICD-10-CM | POA: Diagnosis not present

## 2023-11-17 DIAGNOSIS — E782 Mixed hyperlipidemia: Secondary | ICD-10-CM | POA: Diagnosis not present

## 2023-12-15 DIAGNOSIS — H9201 Otalgia, right ear: Secondary | ICD-10-CM | POA: Diagnosis not present

## 2023-12-30 DIAGNOSIS — R7401 Elevation of levels of liver transaminase levels: Secondary | ICD-10-CM | POA: Diagnosis not present

## 2024-01-16 DIAGNOSIS — E119 Type 2 diabetes mellitus without complications: Secondary | ICD-10-CM | POA: Diagnosis not present

## 2024-02-20 DIAGNOSIS — I10 Essential (primary) hypertension: Secondary | ICD-10-CM | POA: Diagnosis not present

## 2024-02-20 DIAGNOSIS — E782 Mixed hyperlipidemia: Secondary | ICD-10-CM | POA: Diagnosis not present

## 2024-02-20 DIAGNOSIS — E1159 Type 2 diabetes mellitus with other circulatory complications: Secondary | ICD-10-CM | POA: Diagnosis not present

## 2024-02-20 DIAGNOSIS — G5792 Unspecified mononeuropathy of left lower limb: Secondary | ICD-10-CM | POA: Diagnosis not present
